# Patient Record
Sex: Female | Born: 1970 | Race: White | Hispanic: No | Marital: Married | Smoking: Former smoker
Health system: Southern US, Community
[De-identification: ages and names within clinical notes are randomized; demographics above are authoritative.]

## PROBLEM LIST (undated history)

## (undated) DIAGNOSIS — K219 Gastro-esophageal reflux disease without esophagitis: Secondary | ICD-10-CM

## (undated) DIAGNOSIS — Z87898 Personal history of other specified conditions: Secondary | ICD-10-CM

## (undated) DIAGNOSIS — I10 Essential (primary) hypertension: Secondary | ICD-10-CM

## (undated) DIAGNOSIS — Z9889 Other specified postprocedural states: Secondary | ICD-10-CM

## (undated) DIAGNOSIS — Z973 Presence of spectacles and contact lenses: Secondary | ICD-10-CM

## (undated) DIAGNOSIS — N926 Irregular menstruation, unspecified: Secondary | ICD-10-CM

## (undated) DIAGNOSIS — F419 Anxiety disorder, unspecified: Secondary | ICD-10-CM

## (undated) DIAGNOSIS — R112 Nausea with vomiting, unspecified: Secondary | ICD-10-CM

## (undated) HISTORY — DX: Morbid (severe) obesity due to excess calories: E66.01

## (undated) HISTORY — DX: Anxiety disorder, unspecified: F41.9

## (undated) HISTORY — PX: COLPOSCOPY VULVA: SUR281

## (undated) HISTORY — PX: CHOLECYSTECTOMY: SHX55

## (undated) HISTORY — DX: Irregular menstruation, unspecified: N92.6

## (undated) HISTORY — PX: GALLBLADDER SURGERY: SHX652

## (undated) HISTORY — DX: Essential (primary) hypertension: I10

## (undated) HISTORY — DX: Personal history of other specified conditions: Z87.898

---

## 2004-05-09 HISTORY — PX: TUBAL LIGATION: SHX77

## 2005-01-07 ENCOUNTER — Ambulatory Visit (HOSPITAL_COMMUNITY): Admission: RE | Admit: 2005-01-07 | Discharge: 2005-01-07 | Payer: Self-pay | Admitting: Gynecology

## 2005-07-25 ENCOUNTER — Ambulatory Visit: Payer: Self-pay | Admitting: Family Medicine

## 2006-06-19 ENCOUNTER — Ambulatory Visit: Payer: Self-pay | Admitting: Gynecology

## 2006-06-19 ENCOUNTER — Encounter (INDEPENDENT_AMBULATORY_CARE_PROVIDER_SITE_OTHER): Payer: Self-pay | Admitting: Gynecology

## 2007-05-22 ENCOUNTER — Encounter: Payer: Self-pay | Admitting: Obstetrics & Gynecology

## 2007-05-22 ENCOUNTER — Ambulatory Visit: Payer: Self-pay | Admitting: Obstetrics & Gynecology

## 2007-06-26 ENCOUNTER — Ambulatory Visit: Payer: Self-pay | Admitting: Gynecology

## 2009-05-18 ENCOUNTER — Other Ambulatory Visit: Admission: RE | Admit: 2009-05-18 | Discharge: 2009-05-18 | Payer: Self-pay | Admitting: Obstetrics and Gynecology

## 2009-05-18 ENCOUNTER — Ambulatory Visit: Payer: Self-pay | Admitting: Obstetrics and Gynecology

## 2010-09-21 NOTE — Assessment & Plan Note (Signed)
Natalie Gordon, Natalie Gordon             ACCOUNT NO.:  [id number]   MEDICAL RECORD NO.:  [id number]          PATIENT TYPE:  POB   LOCATION:  [facility] at [facility]         FACILITY:  [facility]   PHYSICIAN:  Argentina Donovan, MD        DATE OF BIRTH:  1970-06-18   DATE OF SERVICE:  05/18/2009                                  CLINIC NOTE   The patient is a 40 year old Caucasian female gravida 2, para 2-0-0-2  who is in for annual physical examination.  The last time she was in  here was 2 years ago.  Since then she has also been seeing internist.  She was started on hydrochlorothiazide for her blood pressure, which was  significantly high last time she was here and she was on Tri-Sprintec to  control her periods but the Dr. Percell Belt stopped that until her blood  pressure got under control.  She started back on it at her own herself  by, I really do not think she took a break at all because she said she  cannot even get out of bathroom when she does not have the pills to  control the bleeding, but she is out of work now, has difficulty as far  as the amount of medication she has and paying for it, i.e. she was on  Lexapro for depression and stopped that.  I talked to her about some of  the discount pharmacies and we are going to start her back on generic  Prozac, we are going to increase her hydrochlorothiazide to 25 mg a day.  She is seeing her internist shortly and see if that gets her blood  pressure under better condition and have her taken a list of blood  pressures whenever she goes to the pharmacy and take it to her internist  when she sees her.  Also, we are switching her to Sprintec and she seems  to be okay with that.  Other than that except for the nervousness she  has because she is out of a job and the stress that she is under, she is  in pretty good health.   PAST MEDICAL HISTORY:  She has had elevated blood pressure.   PAST SURGICAL HISTORY:  She has had a gallbladder removed, bilateral  tubal ligation in the past.   SOCIAL HISTORY:  She is divorced.  She does not drink or smoke.   MEDICATION:  She is currently on is the Tri-Sprintec and  hydrochlorothiazide.   REVIEW OF SYSTEMS:  Negative with the exception of present illness.  As  a matter fact, the headache she was having in the past since she started  on blood pressure medication have improved enormously.   PHYSICAL EXAMINATION:  VITAL SIGNS:  She weighs exactly the amount she  did 2 years ago, 231 pounds.  She is 5 feet 4 inches tall.  Her blood  pressure today when she came in was 147/97, significantly better than  before, and I think she may need another medication besides  hydrochlorothiazide, but we will up that to 25 mg on that.  GENERAL:  She is a well-developed, slightly obese white female in no  acute distress.  HEENT:  Within normal limits.  NECK:  Supple.  Thyroid symmetrical, no masses.  BACK:  Erect.  No CVA tenderness.  LUNGS:  Clear to auscultation, percussion.  HEART:  No murmur, normal sinus rhythm.  ABDOMEN:  Soft, flat, nontender.  No masses or organomegaly.  BREASTS:  Symmetrical, no axillary nodes or supraclavicular nodes  palpable.  No nipple discharge.  No dominant mass.  EXTREMITIES:  No edema.  No varicosities.  GENITALIA:  External genitalia is normal.  BUS within normal limits.  Vagina is clean and well rugated.  The cervix is clean, parous, and  slight ectropion.  Pap smear was taken.  The uterus could not be well  outlined nor could the adnexa, but there was no feeling that it was  enlarged.  RECTAL:  Negative for masses.   IMPRESSION:  Normal physical examination with the exception of the  patient's weight, mild elevation of blood pressure.   PLAN:  Sprintec ordered for the patient and monitor her blood pressure  and talk to her internist.  I have also suggested that she may think  about a Mirena if the internist insists that she stops the birth control  pills.  Also add  Prozac to this and see how well she does on that.           ______________________________  Argentina Donovan, MD     PR/MEDQ  D:  05/18/2009  T:  05/19/2009  Job:  [id number]

## 2010-09-21 NOTE — Assessment & Plan Note (Signed)
[name], [name]             ACCOUNT NO.:  [id number]   MEDICAL RECORD NO.:  [id number]          PATIENT TYPE:  POB   LOCATION:  [facility] at [facility]         FACILITY:  [facility]   PHYSICIAN:  Matt Deleeuw, N.P.       DATE OF BIRTH:  April 07, 1971   DATE OF SERVICE:  05/22/2007                                  CLINIC NOTE   HISTORY OF PRESENT ILLNESS:  Ms. Natalie Gordon is a 40 year old white female  who presents today for her annual exam.  Her chief complaint today is  stress related to child support and dealing with her ex-husband.  She  also reports that she was laid off from her job of 10 years in November  of 2008 with her severance package ending in a month and a half and is  having a difficult time securing employment.  After taking her blood  pressure and telling her that it was elevated, she did admit to knowing  that she had a high blood pressure recently but has not had this  evaluated.   PAST MEDICAL HISTORY:  Significant for pneumonia, elevated blood  pressure.   PAST SURGICAL HISTORY:  Unchanged.   GYN HISTORY:  Bilateral tubal ligation in 2006. She has been long-  cycling on Sprintec, so she is not sure when her last menstrual period  was.   SOCIAL HISTORY:  Divorced, one sexual partner now.  Two children, ages  50 and four.  Unemployed at this point in time which is a source of  stress.  She denies alcohol and tobacco use.  Menstrual history again is  long-cycling on Sprintec and does not know her last menstrual period.   CURRENT MEDICATIONS:  Lexapro 20 mg and Tri Sprintec 28.   REVIEW OF SYSTEMS:  Complains of headaches which she attributes to  stress, blood pressure.   ALLERGIES:  NO KNOWN DRUG ALLERGIES.   PHYSICAL EXAM:  VITAL SIGNS:  Today blood pressure 162/105 with a  recheck of 150/104, weight 231 pounds, height 5 feet 4 inches.  HEAD, EARS, NOSE AND THROAT:  Without thyroid enlargement.  CHEST:  Bilaterally clear.  HEART:  Rate and rhythm are regular  without murmur, gallop or cardiac  enlargement.  BREASTS:  Bilaterally soft without masses, nodes or nipple  discharge.  ABDOMEN:  Soft and nontender.  The patient is overweight.  PELVIC:  External genitalia within normal limits for female.  Vagina is  clean and rugose.  Cervix is parous and clean.  The uterus is of normal  shape, size and contour.  Adnexa bilaterally clear.  Rectovaginal exam  confirms.  EXTREMITIES:  Within normal limits.   IMPRESSION:  1. Elevated blood pressure.  2. Normal gynecologic exam.  3. Depression treated with Lexapro.  4. Increased stress due to social reasons.   PLAN:  Blood pressures were written on a prescription pad and the  patient will go to her primary care physician, Dr. Yetta Barre, in [place],  [facility] tomorrow.   DISPOSITION:  1. Offers walk-in hours in the morning, and she plans to be seen      there.  2. Discontinue Tri Sprintec immediately.  She has had a tubal  ligation      so does not need this for contraception.  However, was on Sprintec      to control irregular cycles.  After her blood pressure has been      evaluated, we will consider __________  IUD.  3. Pap smear results in 1-2 weeks. Will notify the patient of the      results.  4. Discussed at length with the patient the relationship of birth      control pills and elevated blood pressure in her age, and she      understands why they are being discontinued.  She is to return here      in 1 year for her annual exam or at which time she decides to use      the IUD for control of cycles.           ______________________________  Matt Elkhatib, N.P.     EMK/MEDQ  D:  05/22/2007  T:  05/23/2007  Job:  [id number]

## 2010-09-24 NOTE — Op Note (Signed)
[name], Natalie Gordon             ACCOUNT NO.:  [id number]   MEDICAL RECORD NO.:  [id number]          PATIENT TYPE:  AMB   LOCATION:  [facility]                           FACILITY:  [facility]   PHYSICIAN:  Ginger Carne, MD  DATE OF BIRTH:  [birth year]   DATE OF PROCEDURE:  01/07/2005  DATE OF DISCHARGE:                                 OPERATIVE REPORT   PREOPERATIVE DIAGNOSIS:  Request for sterilization.   POSTOPERATIVE DIAGNOSIS:  Request for sterilization.   PROCEDURE:  Bilateral laparoscopic tubal banding.   SURGEON:  Ginger Carne, M.D.   ASSISTANT:  None.   COMPLICATIONS:  None immediate.   ESTIMATED BLOOD LOSS:  Minimal.   SPECIMEN:  None.   ANESTHESIA:  General.   OPERATIVE FINDINGS:  Uterus, tubes and ovaries were unremarkable.  Both  tubes identified separate and apart from the respective round ligaments and  identified from their isthmus to fimbriated ends.  Upper abdomen normal.   OPERATIVE PROCEDURE:  The patient prepped and draped in the usual fashion  and placed in a lithotomy position.  Betadine solution used for antiseptic  and the patient was catheterized prior to procedure.  After adequate general  anesthesia, tenaculum placed on the anterior lip of the cervix and a Hickman  tenaculum placed in the endocervical canal.  Afterwards, a vertical  infraumbilical incision was made and the Veress needle placed in the  abdomen.  Opening and closing pressures were 10 and 15 mmHg.  A medial  release trocar placed in the same incision.  Laparoscope placed in the  trocar sleeve.  Afterwards, an 8 mm port was made under direct visualization  in the left lower quadrant.  Following this, the patient was placed in  Trendelenburg and both tubes identified as aforementioned.  Falope ring  bands placed at the isthmus-ampullary junction of both tubes and no bleeding  noted and they were secure.  Gas released, trocars removed.  Closure of 10  mm fascia site was with 0 Vicryl  suture and a 4-0 Vicryl for subcuticular  closure.  The instrument and sponge count were correct.  The patient  tolerated the procedure well and was transferred to the post anesthesia  recovery room in excellent condition.     Ginger Carne, MD  Electronically Signed    SHB/MEDQ  D:  01/07/2005  T:  01/07/2005  Job:  [id number]

## 2012-03-13 ENCOUNTER — Encounter: Payer: Self-pay | Admitting: Obstetrics and Gynecology

## 2012-03-13 ENCOUNTER — Ambulatory Visit (INDEPENDENT_AMBULATORY_CARE_PROVIDER_SITE_OTHER): Payer: PRIVATE HEALTH INSURANCE | Admitting: Obstetrics and Gynecology

## 2012-03-13 VITALS — BP 165/92 | HR 71 | Ht 64.0 in | Wt 236.0 lb

## 2012-03-13 DIAGNOSIS — Z124 Encounter for screening for malignant neoplasm of cervix: Secondary | ICD-10-CM

## 2012-03-13 DIAGNOSIS — N926 Irregular menstruation, unspecified: Secondary | ICD-10-CM | POA: Insufficient documentation

## 2012-03-13 DIAGNOSIS — N949 Unspecified condition associated with female genital organs and menstrual cycle: Secondary | ICD-10-CM

## 2012-03-13 DIAGNOSIS — Z1151 Encounter for screening for human papillomavirus (HPV): Secondary | ICD-10-CM

## 2012-03-13 DIAGNOSIS — N925 Other specified irregular menstruation: Secondary | ICD-10-CM

## 2012-03-13 DIAGNOSIS — Z01419 Encounter for gynecological examination (general) (routine) without abnormal findings: Secondary | ICD-10-CM

## 2012-03-13 DIAGNOSIS — N938 Other specified abnormal uterine and vaginal bleeding: Secondary | ICD-10-CM | POA: Insufficient documentation

## 2012-03-13 LAB — LIPID PANEL
Cholesterol: 149 mg/dL (ref 0–200)
HDL: 44 mg/dL (ref >39)
Total CHOL/HDL Ratio: 3.4 Ratio
Triglycerides: 164 mg/dL — ABNORMAL HIGH (ref <150)
VLDL: 33 mg/dL (ref 0–40)

## 2012-03-13 MED ORDER — HYDROCHLOROTHIAZIDE 25 MG PO TABS: 25.0000 mg | ORAL_TABLET | Freq: Every day | ORAL | Status: DC

## 2012-03-13 MED ORDER — INFLUENZA VIRUS VACC SPLIT PF IM SUSP: 0.5000 mL | Freq: Once | INTRAMUSCULAR | Status: DC

## 2012-03-13 NOTE — Patient Instructions (Addendum)
Preventive Care for Adults, Female A healthy lifestyle and preventive care can promote health and wellness. Preventive health guidelines for women include the following key practices.  A routine yearly physical is a good way to check with your caregiver about your health and preventive screening. It is a chance to share any concerns and updates on your health, and to receive a thorough exam.  Visit your dentist for a routine exam and preventive care every 6 months. Brush your teeth twice a day and floss once a day. Good oral hygiene prevents tooth decay and gum disease.  The frequency of eye exams is based on your age, health, family medical history, use of contact lenses, and other factors. Follow your caregiver's recommendations for frequency of eye exams.  Eat a healthy diet. Foods like vegetables, fruits, whole grains, low-fat dairy products, and lean protein foods contain the nutrients you need without too many calories. Decrease your intake of foods high in solid fats, added sugars, and salt. Eat the right amount of calories for you.Get information about a proper diet from your caregiver, if necessary.  Regular physical exercise is one of the most important things you can do for your health. Most adults should get at least 150 minutes of moderate-intensity exercise (any activity that increases your heart rate and causes you to sweat) each week. In addition, most adults need muscle-strengthening exercises on 2 or more days a week.  Maintain a healthy weight. The body mass index (BMI) is a screening tool to identify possible weight problems. It provides an estimate of body fat based on height and weight. Your caregiver can help determine your BMI, and can help you achieve or maintain a healthy weight.For adults 20 years and older:  A BMI below 18.5 is considered underweight.  A BMI of 18.5 to 24.9 is normal.  A BMI of 25 to 29.9 is considered overweight.  A BMI of 30 and above is  considered obese.  Maintain normal blood lipids and cholesterol levels by exercising and minimizing your intake of saturated fat. Eat a balanced diet with plenty of fruit and vegetables. Blood tests for lipids and cholesterol should begin at age 20 and be repeated every 5 years. If your lipid or cholesterol levels are high, you are over 50, or you are at high risk for heart disease, you may need your cholesterol levels checked more frequently.Ongoing high lipid and cholesterol levels should be treated with medicines if diet and exercise are not effective.  If you smoke, find out from your caregiver how to quit. If you do not use tobacco, do not start.  If you are pregnant, do not drink alcohol. If you are breastfeeding, be very cautious about drinking alcohol. If you are not pregnant and choose to drink alcohol, do not exceed 1 drink per day. One drink is considered to be 12 ounces (355 mL) of beer, 5 ounces (148 mL) of wine, or 1.5 ounces (44 mL) of liquor.  Avoid use of street drugs. Do not share needles with anyone. Ask for help if you need support or instructions about stopping the use of drugs.  High blood pressure causes heart disease and increases the risk of stroke. Your blood pressure should be checked at least every 1 to 2 years. Ongoing high blood pressure should be treated with medicines if weight loss and exercise are not effective.  If you are 55 to 40 years old, ask your caregiver if you should take aspirin to prevent strokes.  Diabetes   screening involves taking a blood sample to check your fasting blood sugar level. This should be done once every 3 years, after age 45, if you are within normal weight and without risk factors for diabetes. Testing should be considered at a younger age or be carried out more frequently if you are overweight and have at least 1 risk factor for diabetes.  Breast cancer screening is essential preventive care for women. You should practice "breast  self-awareness." This means understanding the normal appearance and feel of your breasts and may include breast self-examination. Any changes detected, no matter how small, should be reported to a caregiver. Women in their 20s and 30s should have a clinical breast exam (CBE) by a caregiver as part of a regular health exam every 1 to 3 years. After age 40, women should have a CBE every year. Starting at age 40, women should consider having a mammography (breast X-ray test) every year. Women who have a family history of breast cancer should talk to their caregiver about genetic screening. Women at a high risk of breast cancer should talk to their caregivers about having magnetic resonance imaging (MRI) and a mammography every year.  The Pap test is a screening test for cervical cancer. A Pap test can show cell changes on the cervix that might become cervical cancer if left untreated. A Pap test is a procedure in which cells are obtained and examined from the lower end of the uterus (cervix).  Women should have a Pap test starting at age 21.  Between ages 21 and 29, Pap tests should be repeated every 2 years.  Beginning at age 30, you should have a Pap test every 3 years as long as the past 3 Pap tests have been normal.  Some women have medical problems that increase the chance of getting cervical cancer. Talk to your caregiver about these problems. It is especially important to talk to your caregiver if a new problem develops soon after your last Pap test. In these cases, your caregiver may recommend more frequent screening and Pap tests.  The above recommendations are the same for women who have or have not gotten the vaccine for human papillomavirus (HPV).  If you had a hysterectomy for a problem that was not cancer or a condition that could lead to cancer, then you no longer need Pap tests. Even if you no longer need a Pap test, a regular exam is a good idea to make sure no other problems are  starting.  If you are between ages 65 and 70, and you have had normal Pap tests going back 10 years, you no longer need Pap tests. Even if you no longer need a Pap test, a regular exam is a good idea to make sure no other problems are starting.  If you have had past treatment for cervical cancer or a condition that could lead to cancer, you need Pap tests and screening for cancer for at least 20 years after your treatment.  If Pap tests have been discontinued, risk factors (such as a new sexual partner) need to be reassessed to determine if screening should be resumed.  The HPV test is an additional test that may be used for cervical cancer screening. The HPV test looks for the virus that can cause the cell changes on the cervix. The cells collected during the Pap test can be tested for HPV. The HPV test could be used to screen women aged 30 years and older, and should   be used in women of any age who have unclear Pap test results. After the age of 30, women should have HPV testing at the same frequency as a Pap test.  Colorectal cancer can be detected and often prevented. Most routine colorectal cancer screening begins at the age of 50 and continues through age 75. However, your caregiver may recommend screening at an earlier age if you have risk factors for colon cancer. On a yearly basis, your caregiver may provide home test kits to check for hidden blood in the stool. Use of a small camera at the end of a tube, to directly examine the colon (sigmoidoscopy or colonoscopy), can detect the earliest forms of colorectal cancer. Talk to your caregiver about this at age 50, when routine screening begins. Direct examination of the colon should be repeated every 5 to 10 years through age 75, unless early forms of pre-cancerous polyps or small growths are found.  Hepatitis C blood testing is recommended for all people born from 1945 through 1965 and any individual with known risks for hepatitis C.  Practice  safe sex. Use condoms and avoid high-risk sexual practices to reduce the spread of sexually transmitted infections (STIs). STIs include gonorrhea, chlamydia, syphilis, trichomonas, herpes, HPV, and human immunodeficiency virus (HIV). Herpes, HIV, and HPV are viral illnesses that have no cure. They can result in disability, cancer, and death. Sexually active women aged 25 and younger should be checked for chlamydia. Older women with new or multiple partners should also be tested for chlamydia. Testing for other STIs is recommended if you are sexually active and at increased risk.  Osteoporosis is a disease in which the bones lose minerals and strength with aging. This can result in serious bone fractures. The risk of osteoporosis can be identified using a bone density scan. Women ages 65 and over and women at risk for fractures or osteoporosis should discuss screening with their caregivers. Ask your caregiver whether you should take a calcium supplement or vitamin D to reduce the rate of osteoporosis.  Menopause can be associated with physical symptoms and risks. Hormone replacement therapy is available to decrease symptoms and risks. You should talk to your caregiver about whether hormone replacement therapy is right for you.  Use sunscreen with sun protection factor (SPF) of 30 or more. Apply sunscreen liberally and repeatedly throughout the day. You should seek shade when your shadow is shorter than you. Protect yourself by wearing long sleeves, pants, a wide-brimmed hat, and sunglasses year round, whenever you are outdoors.  Once a month, do a whole body skin exam, using a mirror to look at the skin on your back. Notify your caregiver of new moles, moles that have irregular borders, moles that are larger than a pencil eraser, or moles that have changed in shape or color.  Stay current with required immunizations.  Influenza. You need a dose every fall (or winter). The composition of the flu vaccine  changes each year, so being vaccinated once is not enough.  Pneumococcal polysaccharide. You need 1 to 2 doses if you smoke cigarettes or if you have certain chronic medical conditions. You need 1 dose at age 65 (or older) if you have never been vaccinated.  Tetanus, diphtheria, pertussis (Tdap, Td). Get 1 dose of Tdap vaccine if you are younger than age 65, are over 65 and have contact with an infant, are a healthcare worker, are pregnant, or simply want to be protected from whooping cough. After that, you need a Td   booster dose every 10 years. Consult your caregiver if you have not had at least 3 tetanus and diphtheria-containing shots sometime in your life or have a deep or dirty wound.  HPV. You need this vaccine if you are a woman age 26 or younger. The vaccine is given in 3 doses over 6 months.  Measles, mumps, rubella (MMR). You need at least 1 dose of MMR if you were born in 1957 or later. You may also need a second dose.  Meningococcal. If you are age 19 to 21 and a first-year college student living in a residence hall, or have one of several medical conditions, you need to get vaccinated against meningococcal disease. You may also need additional booster doses.  Zoster (shingles). If you are age 60 or older, you should get this vaccine.  Varicella (chickenpox). If you have never had chickenpox or you were vaccinated but received only 1 dose, talk to your caregiver to find out if you need this vaccine.  Hepatitis A. You need this vaccine if you have a specific risk factor for hepatitis A virus infection or you simply wish to be protected from this disease. The vaccine is usually given as 2 doses, 6 to 18 months apart.  Hepatitis B. You need this vaccine if you have a specific risk factor for hepatitis B virus infection or you simply wish to be protected from this disease. The vaccine is given in 3 doses, usually over 6 months. Preventive Services / Frequency Ages 40 to 64  Blood  pressure check.** / Every 1 to 2 years.  Lipid and cholesterol check.** / Every 5 years beginning at age 20.  Clinical breast exam.** / Every year after age 40.  Mammogram.** / Every year beginning at age 40 and continuing for as long as you are in good health. Consult with your caregiver.  Pap test.** / Every 3 years starting at age 30 through age 65 or 70 with a history of 3 consecutive normal Pap tests.  HPV screening.** / Every 3 years from ages 30 through ages 65 to 70 with a history of 3 consecutive normal Pap tests.  Fecal occult blood test (FOBT) of stool. / Every year beginning at age 50 and continuing until age 75. You may not need to do this test if you get a colonoscopy every 10 years.  Flexible sigmoidoscopy or colonoscopy.** / Every 5 years for a flexible sigmoidoscopy or every 10 years for a colonoscopy beginning at age 50 and continuing until age 75.  Hepatitis C blood test.** / For all people born from 1945 through 1965 and any individual with known risks for hepatitis C.  Skin self-exam. / Monthly.  Influenza immunization.** / Every year.  Pneumococcal polysaccharide immunization.** / 1 to 2 doses if you smoke cigarettes or if you have certain chronic medical conditions.  Tetanus, diphtheria, pertussis (Tdap, Td) immunization.** / A one-time dose of Tdap vaccine. After that, you need a Td booster dose every 10 years.  Measles, mumps, rubella (MMR) immunization. / You need at least 1 dose of MMR if you were born in 1957 or later. You may also need a second dose.  Varicella immunization.** / Consult your caregiver.  Meningococcal immunization.** / Consult your caregiver.  Hepatitis A immunization.** / Consult your caregiver. 2 doses, 6 to 18 months apart.  Hepatitis B immunization.** / Consult your caregiver. 3 doses, usually over 6 months. ** Family history and personal history of risk and conditions may change your caregiver's recommendations.   Document Released:  06/21/2001 Document Revised: 07/18/2011 Document Reviewed: 09/20/2010 [employer] Patient Information 2013 [employer].  

## 2012-03-13 NOTE — Progress Notes (Signed)
  Subjective:     Natalie Gordon is a 41 y.o. female with LMP 02/11/2012 and BMI 40 who is here for a comprehensive physical exam. The patient reports heavy menses. Patient has monthly menses lasting 5-7 days every month but with a heavy flow and passage of clots. Patient states this bleeding pattern has been present since her BTL. It was well controlled on birth control pills but due to HTN, OCP had to be discontinued. Due to a lapse in insurance coverage, patient has not seen a doctor in 2 years. She has not been taking her antihypertensive either.  History   Social History  . Marital Status: Married    Spouse Name: N/A    Number of Children: N/A  . Years of Education: N/A   Occupational History  . Not on file.   Social History Main Topics  . Smoking status: Former Games developer  . Smokeless tobacco: Not on file     Comment: quit x 31yrs ago  . Alcohol Use: Yes     Comment: social  . Drug Use: No  . Sexually Active: Yes -- Female partner(s)    Birth Control/ Protection: Surgical     Comment: tubaligation.   Other Topics Concern  . Not on file   Social History Narrative  . No narrative on file   Health Maintenance  Topic Date Due  . Pap Smear  05/19/1989  . Tetanus/tdap  05/19/1990  . Influenza Vaccine  01/08/2012   Past Medical History  Diagnosis Date  . Hypertension   . Irregular menstrual cycle   . Anxiety   . History of abnormal Pap smear     At age 72   Past Surgical History  Procedure Date  . Tubal ligation 2006  . Gallbladder surgery   . Colposcopy vulva     abnormal cell.  . Cholecystectomy        Review of Systems A comprehensive review of systems was negative.   Objective:     GENERAL: Well-developed, well-nourished female in no acute distress.  HEENT: Normocephalic, atraumatic. Sclerae anicteric.  NECK: Supple. Normal thyroid.  LUNGS: Clear to auscultation bilaterally.  HEART: Regular rate and rhythm. BREASTS: Symmetric in size. No palpable masses or  lymphadenopathy, skin changes, or nipple drainage. ABDOMEN: Soft, nontender, nondistended. No organomegaly. PELVIC: Normal external female genitalia. Vagina is pink and rugated.  Normal discharge. Normal appearing cervix. Uterus is normal in size. No adnexal mass or tenderness. EXTREMITIES: No cyanosis, clubbing, or edema, 2+ distal pulses.    Assessment:    Healthy female exam.      Plan:    Pap smear  Collected Patient advised to continue monthly self breast and vulva exam Patient advised to follow-up with internist for better management of hypertension. Will give refill of HCTZ in the meantime Will schedule pelvic ultrasound Medical management of her DUB with Mirena IUD discussed RTC in 2 weeks for endometrial biopsy and results review See After Visit Summary for Counseling Recommendations

## 2012-03-14 LAB — COMPREHENSIVE METABOLIC PANEL
CO2: 24 mEq/L (ref 19–32)
Creat: 0.61 mg/dL (ref 0.50–1.10)
Glucose, Bld: 87 mg/dL (ref 70–99)
Total Bilirubin: 0.3 mg/dL (ref 0.3–1.2)

## 2012-03-27 ENCOUNTER — Ambulatory Visit: Payer: Self-pay | Admitting: Obstetrics and Gynecology

## 2012-04-04 ENCOUNTER — Encounter: Payer: Self-pay | Admitting: Obstetrics and Gynecology

## 2012-04-04 ENCOUNTER — Ambulatory Visit (INDEPENDENT_AMBULATORY_CARE_PROVIDER_SITE_OTHER): Payer: PRIVATE HEALTH INSURANCE | Admitting: Obstetrics and Gynecology

## 2012-04-04 VITALS — BP 159/101 | HR 85 | Ht 64.0 in | Wt 236.0 lb

## 2012-04-04 DIAGNOSIS — N949 Unspecified condition associated with female genital organs and menstrual cycle: Secondary | ICD-10-CM

## 2012-04-04 DIAGNOSIS — Z01812 Encounter for preprocedural laboratory examination: Secondary | ICD-10-CM

## 2012-04-04 DIAGNOSIS — N938 Other specified abnormal uterine and vaginal bleeding: Secondary | ICD-10-CM

## 2012-04-04 DIAGNOSIS — N926 Irregular menstruation, unspecified: Secondary | ICD-10-CM

## 2012-04-04 NOTE — Progress Notes (Signed)
Interested in taking something for anxiety, something low that might help.

## 2012-04-04 NOTE — Progress Notes (Signed)
Patient ID: Natalie Gordon, female   DOB: 1970/06/29, 41 y.o.   MRN: [id number] Patient presents today to discuss results of ultrasound and have endometrial biopsy performed. Results of the ultrasound were reviewed and explained to the patient which demonstrated a uterus measuring 9 x 7 x 5 cm with an endometrial lining of 1.32 cm. Normal ovaries. No free fluid. 2.65 x 2.70 x 2.86 cm anterior right fundal fibroid.  ENDOMETRIAL BIOPSY     The indications for endometrial biopsy were reviewed.   Risks of the biopsy including cramping, bleeding, infection, uterine perforation, inadequate specimen and need for additional procedures  were discussed. The patient states she understands and agrees to undergo procedure today. Consent was signed. Time out was performed. Urine HCG was negative. A sterile speculum was placed in the patient's vagina and the cervix was prepped with Betadine. A single-toothed tenaculum was placed on the anterior lip of the cervix to stabilize it. The uterine cavity was sounded to a depth of 9 cm using the uterine sound. The 3 mm pipelle was introduced into the endometrial cavity without difficulty, 2 passes were made.  A  moderate amount of tissue was  sent to pathology. The instruments were removed from the patient's vagina. Minimal bleeding from the cervix was noted. The patient tolerated the procedure well.  Routine post-procedure instructions were given to the patient. The patient will follow up in two weeks to review the results and for further management.    Patient will RTC in 2 weeks for results and Mirena IUD insertion

## 2012-04-09 ENCOUNTER — Encounter: Payer: Self-pay | Admitting: Obstetrics and Gynecology

## 2012-04-10 ENCOUNTER — Ambulatory Visit: Payer: PRIVATE HEALTH INSURANCE | Admitting: Family Medicine

## 2012-04-12 ENCOUNTER — Ambulatory Visit: Payer: Self-pay | Admitting: Obstetrics and Gynecology

## 2012-04-16 ENCOUNTER — Ambulatory Visit (INDEPENDENT_AMBULATORY_CARE_PROVIDER_SITE_OTHER): Payer: PRIVATE HEALTH INSURANCE | Admitting: Obstetrics & Gynecology

## 2012-04-16 ENCOUNTER — Encounter: Payer: Self-pay | Admitting: Obstetrics & Gynecology

## 2012-04-16 VITALS — BP 152/98 | HR 72 | Ht 64.0 in | Wt 235.0 lb

## 2012-04-16 DIAGNOSIS — Z01812 Encounter for preprocedural laboratory examination: Secondary | ICD-10-CM

## 2012-04-16 DIAGNOSIS — N949 Unspecified condition associated with female genital organs and menstrual cycle: Secondary | ICD-10-CM

## 2012-04-16 DIAGNOSIS — N938 Other specified abnormal uterine and vaginal bleeding: Secondary | ICD-10-CM

## 2012-04-16 DIAGNOSIS — N920 Excessive and frequent menstruation with regular cycle: Secondary | ICD-10-CM

## 2012-04-16 NOTE — Progress Notes (Signed)
  Subjective:    Patient ID: Natalie Gordon, female    DOB: 1971/01/04, 41 y.o.   MRN: [id number]  HPI 41 yo MW lady with 7 day periods that are exceedingly heavy, "running down my legs". She was here today for a Mirena, but after she heard about Novasure endometrial ablation, she opts for this.   Review of Systems   Mammogram 12/13 Endometrial biopsy normal Pap normal Objective:   Physical Exam        Assessment & Plan:  Menorrhagia- check TSH, CBC, cervical cultures I will do a Novasure on 04-25-12.

## 2012-04-17 ENCOUNTER — Telehealth: Payer: Self-pay | Admitting: *Deleted

## 2012-04-17 MED ORDER — HYDROCHLOROTHIAZIDE 25 MG PO TABS: 25.0000 mg | ORAL_TABLET | Freq: Every day | ORAL | Status: DC

## 2012-04-17 NOTE — Telephone Encounter (Signed)
Rx printed and so I resent.

## 2012-04-17 NOTE — Telephone Encounter (Signed)
Patient needs refill of her hctz medication.  She is working on finding a primary care physician.

## 2012-04-18 ENCOUNTER — Encounter (HOSPITAL_COMMUNITY): Payer: Self-pay | Admitting: Pharmacist

## 2012-04-18 LAB — CBC
HCT: 38.6 % (ref 36.0–46.0)
MCH: 28.9 pg (ref 26.0–34.0)
MCHC: 33.9 g/dL (ref 30.0–36.0)
MCV: 85.2 fL (ref 78.0–100.0)
RDW: 14.1 % (ref 11.5–15.5)

## 2012-04-24 ENCOUNTER — Encounter (HOSPITAL_COMMUNITY): Payer: Self-pay

## 2012-04-24 ENCOUNTER — Encounter (HOSPITAL_COMMUNITY)
Admission: RE | Admit: 2012-04-24 | Discharge: 2012-04-24 | Disposition: A | Discharge status: Home / Self Care | Payer: PRIVATE HEALTH INSURANCE | Source: Ambulatory Visit | Attending: Obstetrics & Gynecology | Admitting: Obstetrics & Gynecology

## 2012-04-24 HISTORY — DX: Gastro-esophageal reflux disease without esophagitis: K21.9

## 2012-04-24 LAB — CBC
HCT: 40.9 % (ref 36.0–46.0)
Hemoglobin: 13.3 g/dL (ref 12.0–15.0)
MCH: 29 pg (ref 26.0–34.0)
MCHC: 32.5 g/dL (ref 30.0–36.0)
RBC: 4.58 MIL/uL (ref 3.87–5.11)

## 2012-04-24 NOTE — Patient Instructions (Addendum)
[address]  04/24/2012   Your procedure is scheduled on:  04/25/12  Enter through the Main Entrance of [facility] at 8 AM.  Pick up the phone at the desk and dial [phone].   Call this number if you have problems the morning of surgery: [phone]   Remember:   Do not eat food:After Midnight.  Do not drink clear liquids: After Midnight.  Take these medicines the morning of surgery with A SIP OF WATER: Blood pressure medication and Prilosec   Do not wear jewelry, make-up or nail polish.  Do not wear lotions, powders, or perfumes. You may wear deodorant.  Do not shave 48 hours prior to surgery.  Do not bring valuables to the hospital.  Contacts, dentures or bridgework may not be worn into surgery.  Leave suitcase in the car. After surgery it may be brought to your room.  For patients admitted to the hospital, checkout time is 11:00 AM the day of discharge.   Patients discharged the day of surgery will not be allowed to drive home.  Name and phone number of your driver: husband and mother  Special Instructions: Shower using CHG 2 nights before surgery and the night before surgery.  If you shower the day of surgery use CHG.  Use special wash - you have one bottle of CHG for all showers.  You should use approximately 1/3 of the bottle for each shower.   Please read over the following fact sheets that you were given: Surgical Site Infection Prevention

## 2012-04-25 ENCOUNTER — Encounter (HOSPITAL_COMMUNITY): Payer: Self-pay | Admitting: Anesthesiology

## 2012-04-25 ENCOUNTER — Encounter (HOSPITAL_COMMUNITY): Payer: Self-pay | Admitting: *Deleted

## 2012-04-25 ENCOUNTER — Ambulatory Visit (HOSPITAL_COMMUNITY): Payer: PRIVATE HEALTH INSURANCE | Admitting: Anesthesiology

## 2012-04-25 ENCOUNTER — Encounter (HOSPITAL_COMMUNITY)
Admission: RE | Disposition: A | Discharge status: Home / Self Care | Payer: Self-pay | Source: Ambulatory Visit | Attending: Obstetrics & Gynecology

## 2012-04-25 ENCOUNTER — Ambulatory Visit (HOSPITAL_COMMUNITY)
Admission: RE | Admit: 2012-04-25 | Discharge: 2012-04-25 | Disposition: A | Discharge status: Home / Self Care | Payer: PRIVATE HEALTH INSURANCE | Source: Ambulatory Visit | Attending: Obstetrics & Gynecology | Admitting: Obstetrics & Gynecology

## 2012-04-25 DIAGNOSIS — Z01812 Encounter for preprocedural laboratory examination: Secondary | ICD-10-CM | POA: Insufficient documentation

## 2012-04-25 DIAGNOSIS — Z01818 Encounter for other preprocedural examination: Secondary | ICD-10-CM | POA: Insufficient documentation

## 2012-04-25 DIAGNOSIS — N920 Excessive and frequent menstruation with regular cycle: Secondary | ICD-10-CM | POA: Insufficient documentation

## 2012-04-25 HISTORY — PX: NOVASURE ABLATION: SHX5394

## 2012-04-25 LAB — BASIC METABOLIC PANEL
BUN: 11 mg/dL (ref 6–23)
CO2: 27 mEq/L (ref 19–32)
Calcium: 9.4 mg/dL (ref 8.4–10.5)
GFR calc non Af Amer: >90 mL/min (ref >90)
Glucose, Bld: 96 mg/dL (ref 70–99)
Sodium: 138 mEq/L (ref 135–145)

## 2012-04-25 SURGERY — NOVASURE ABLATION
Anesthesia: General | Site: Vagina | Wound class: Clean Contaminated

## 2012-04-25 MED ORDER — BUPIVACAINE HCL (PF) 0.5 % IJ SOLN
INTRAMUSCULAR | Status: DC | PRN
  Administered 2012-04-25: 30 mL

## 2012-04-25 MED ORDER — MIDAZOLAM HCL 5 MG/5ML IJ SOLN
INTRAMUSCULAR | Status: DC | PRN
  Administered 2012-04-25: 2 mg via INTRAVENOUS

## 2012-04-25 MED ORDER — MIDAZOLAM HCL 2 MG/2ML IJ SOLN
INTRAMUSCULAR | Status: AC
  Filled 2012-04-25: qty 2

## 2012-04-25 MED ORDER — CEFAZOLIN SODIUM-DEXTROSE 2-3 GM-% IV SOLR
2.0000 g | INTRAVENOUS | Status: AC
  Administered 2012-04-25: 2 g via INTRAVENOUS

## 2012-04-25 MED ORDER — LIDOCAINE HCL (CARDIAC) 20 MG/ML IV SOLN
INTRAVENOUS | Status: DC | PRN
  Administered 2012-04-25: 50 mg via INTRAVENOUS

## 2012-04-25 MED ORDER — FENTANYL CITRATE 0.05 MG/ML IJ SOLN
INTRAMUSCULAR | Status: AC
  Filled 2012-04-25: qty 5

## 2012-04-25 MED ORDER — OXYCODONE-ACETAMINOPHEN 5-325 MG PO TABS: 1.0000 | ORAL_TABLET | ORAL | Status: DC | PRN

## 2012-04-25 MED ORDER — LIDOCAINE HCL (CARDIAC) 20 MG/ML IV SOLN
INTRAVENOUS | Status: AC
  Filled 2012-04-25: qty 5

## 2012-04-25 MED ORDER — FENTANYL CITRATE 0.05 MG/ML IJ SOLN
INTRAMUSCULAR | Status: DC | PRN
  Administered 2012-04-25 (×2): 50 ug via INTRAVENOUS
  Administered 2012-04-25: 100 ug via INTRAVENOUS
  Administered 2012-04-25: 50 ug via INTRAVENOUS

## 2012-04-25 MED ORDER — CEFAZOLIN SODIUM-DEXTROSE 2-3 GM-% IV SOLR
INTRAVENOUS | Status: AC
  Filled 2012-04-25: qty 50

## 2012-04-25 MED ORDER — ONDANSETRON HCL 4 MG/2ML IJ SOLN: 4.0000 mg | Freq: Once | INTRAMUSCULAR | Status: DC | PRN

## 2012-04-25 MED ORDER — FENTANYL CITRATE 0.05 MG/ML IJ SOLN
25.0000 ug | INTRAMUSCULAR | Status: DC | PRN
  Administered 2012-04-25: 25 ug via INTRAVENOUS

## 2012-04-25 MED ORDER — LACTATED RINGERS IV SOLN
INTRAVENOUS | Status: DC
  Administered 2012-04-25 (×3): via INTRAVENOUS

## 2012-04-25 MED ORDER — FENTANYL CITRATE 0.05 MG/ML IJ SOLN
INTRAMUSCULAR | Status: AC
  Filled 2012-04-25: qty 2

## 2012-04-25 MED ORDER — IBUPROFEN 800 MG PO TABS: 800.0000 mg | ORAL_TABLET | Freq: Three times a day (TID) | ORAL | Status: DC | PRN

## 2012-04-25 MED ORDER — BUPIVACAINE HCL (PF) 0.5 % IJ SOLN
INTRAMUSCULAR | Status: AC
  Filled 2012-04-25: qty 30

## 2012-04-25 MED ORDER — PROPOFOL 10 MG/ML IV EMUL
INTRAVENOUS | Status: AC
  Filled 2012-04-25: qty 20

## 2012-04-25 MED ORDER — KETOROLAC TROMETHAMINE 30 MG/ML IJ SOLN
15.0000 mg | Freq: Once | INTRAMUSCULAR | Status: AC | PRN
  Administered 2012-04-25: 30 mg via INTRAVENOUS

## 2012-04-25 MED ORDER — DEXAMETHASONE SODIUM PHOSPHATE 10 MG/ML IJ SOLN
INTRAMUSCULAR | Status: AC
  Filled 2012-04-25: qty 1

## 2012-04-25 MED ORDER — ONDANSETRON HCL 4 MG/2ML IJ SOLN
INTRAMUSCULAR | Status: AC
  Filled 2012-04-25: qty 2

## 2012-04-25 MED ORDER — KETOROLAC TROMETHAMINE 30 MG/ML IJ SOLN
INTRAMUSCULAR | Status: AC
  Filled 2012-04-25: qty 1

## 2012-04-25 MED ORDER — DEXAMETHASONE SODIUM PHOSPHATE 10 MG/ML IJ SOLN
INTRAMUSCULAR | Status: DC | PRN
  Administered 2012-04-25: 10 mg via INTRAVENOUS

## 2012-04-25 MED ORDER — PROPOFOL 10 MG/ML IV EMUL
INTRAVENOUS | Status: DC | PRN
  Administered 2012-04-25: 180 mg via INTRAVENOUS

## 2012-04-25 MED ORDER — ONDANSETRON HCL 4 MG/2ML IJ SOLN
INTRAMUSCULAR | Status: DC | PRN
  Administered 2012-04-25: 4 mg via INTRAVENOUS

## 2012-04-25 MED ORDER — MEPERIDINE HCL 25 MG/ML IJ SOLN: 6.2500 mg | INTRAMUSCULAR | Status: DC | PRN

## 2012-04-25 SURGICAL SUPPLY — 14 items
ABLATOR ENDOMETRIAL BIPOLAR (ABLATOR) ×2 IMPLANT
CATH ROBINSON RED A/P 16FR (CATHETERS) ×2 IMPLANT
CLOTH BEACON ORANGE TIMEOUT ST (SAFETY) ×2 IMPLANT
GLOVE BIO SURGEON STRL SZ 6.5 (GLOVE) ×4 IMPLANT
GLOVE BIOGEL PI IND STRL 6.5 (GLOVE) ×2 IMPLANT
GLOVE BIOGEL PI INDICATOR 6.5 (GLOVE) ×2
GLOVE ECLIPSE 6.0 STRL STRAW (GLOVE) ×4 IMPLANT
GOWN PREVENTION PLUS LG XLONG (DISPOSABLE) ×4 IMPLANT
NEEDLE SPNL 22GX3.5 QUINCKE BK (NEEDLE) ×2 IMPLANT
PACK VAGINAL MINOR WOMEN LF (CUSTOM PROCEDURE TRAY) ×2 IMPLANT
PAD OB MATERNITY 4.3X12.25 (PERSONAL CARE ITEMS) ×2 IMPLANT
SYR CONTROL 10ML LL (SYRINGE) ×2 IMPLANT
TOWEL OR 17X24 6PK STRL BLUE (TOWEL DISPOSABLE) ×4 IMPLANT
WATER STERILE IRR 1000ML POUR (IV SOLUTION) ×2 IMPLANT

## 2012-04-25 NOTE — H&P (Signed)
Natalie Gordon is an 41 y.o. MW female. She is here for an endometrial ablation due to her increasingly heavy periods. They now last about 7 days each month, so heavy that "It runs down my legs. I'm leaving DNA everywhere". She sometimes has to leave work due to the bleeding. Sometimes bleeds through her clothing.  Pertinent Gynecological History: Menses: flow is excessive with use of 10+ pads or tampons on heaviest days Bleeding: heavy Contraception:BTL  DES exposure: denies Blood transfusions: none Sexually transmitted diseases: no past history Previous GYN Procedures: endometrial biopsy normal  Last mammogram: normal Date: 12/13 Last pap: normal Date: 2013 OB History: G2, P2   Menstrual History: Menarche age: 89 Patient's last menstrual period was 04/16/2012.    Past Medical History  Diagnosis Date  . Hypertension   . Irregular menstrual cycle   . Anxiety   . History of abnormal Pap smear     At age 61  . Morbid obesity   . GERD (gastroesophageal reflux disease)     Past Surgical History  Procedure Date  . Tubal ligation 2006  . Gallbladder surgery   . Colposcopy vulva     abnormal cell.  . Cholecystectomy     Family History  Problem Relation Age of Onset  . Arthritis Mother   . Cancer Mother     ovarian  . Hypertension Mother   . Depression Mother   . Heart disease Father     heart attack  . Ulcers Maternal Grandmother     bleeding ulcers  . Hypertension Maternal Grandmother   . Heart disease Maternal Grandfather     heart attack  . Diabetes Maternal Grandfather   . Asthma Maternal Grandfather   . Dementia Maternal Grandfather   . Dementia Paternal Grandmother   . Heart disease Paternal Grandfather     heart attack    Social History:  reports that she has quit smoking. She does not have any smokeless tobacco history on file. She reports that she drinks alcohol. She reports that she does not use illicit drugs.  Allergies: No Known  Allergies  Prescriptions prior to admission  Medication Sig Dispense Refill  . hydrochlorothiazide (HYDRODIURIL) 25 MG tablet Take 1 tablet (25 mg total) by mouth daily.  30 tablet  6  . omeprazole (PRILOSEC) 20 MG capsule Take 20 mg by mouth daily.      Marland Kitchen ibuprofen (ADVIL,MOTRIN) 200 MG tablet Take 400 mg by mouth 2 (two) times daily as needed.      . Multiple Vitamin (MULTIVITAMIN WITH MINERALS) TABS Take 1 tablet by mouth daily.      . naproxen sodium (ANAPROX) 220 MG tablet Take 440 mg by mouth daily as needed.        ROS Married for 3 months, no dyspareunia. Environmental health practitioner at [employer].  Blood pressure 142/82, pulse 84, temperature 98.1 F (36.7 C), temperature source Oral, resp. rate 18, last menstrual period 04/16/2012, SpO2 98.00%. Physical Exam Heart- rrr Lungs- CTAB Abd- benign  Results for orders placed during the hospital encounter of 04/25/12 (from the past 24 hour(s))  PREGNANCY, URINE     Status: Normal   Collection Time   04/25/12  8:00 AM      Component Value Range   Preg Test, Ur NEGATIVE  NEGATIVE    No results found.  Assessment/Plan: Menorrhagia- She declines Mirena and wishes to have an ablation. I have quoted her a satisfaction rate of 90+% and an amenorrhea rate of 40%.  Bartlett Enke C. 04/25/2012, 8:53 AM

## 2012-04-25 NOTE — Anesthesia Postprocedure Evaluation (Signed)
Anesthesia Post Note  Patient: Natalie Gordon  Procedure(s) Performed: Procedure(s) (LRB): NOVASURE ABLATION (N/A)  Anesthesia type: General  Patient location: PACU  Post pain: Pain level controlled  Post assessment: Post-op Vital signs reviewed  Last Vitals:  Filed Vitals:   04/25/12 1000  BP: 120/64  Pulse: 85  Temp:   Resp: 12    Post vital signs: Reviewed  Level of consciousness: sedated  Complications: No apparent anesthesia complicationsfj

## 2012-04-25 NOTE — Op Note (Signed)
04/25/2012  9:42 AM  PATIENT:  Natalie Gordon  41 y.o. female  PRE-OPERATIVE DIAGNOSIS:  Menorrhagia  POST-OPERATIVE DIAGNOSIS:  Menorrhagia  PROCEDURE:  Procedure(s) (LRB) with comments: NOVASURE ABLATION (N/A)  SURGEON:  Surgeon(s) and Role:    * Allie Bossier, MD - Primary  PHYSICIAN ASSISTANT:   ASSISTANTS: none   ANESTHESIA:   general  EBL:  Total I/O In: 1000 [I.V.:1000] Out: 55 [Urine:5; Blood:50]  BLOOD ADMINISTERED:none  DRAINS: none   LOCAL MEDICATIONS USED:  MARCAINE     SPECIMEN:  No Specimen  DISPOSITION OF SPECIMEN:  N/A  COUNTS:  YES  TOURNIQUET:  * No tourniquets in log *  DICTATION: .Dragon Dictation  PLAN OF CARE: Discharge to home after PACU  PATIENT DISPOSITION:  PACU - hemodynamically stable.   Delay start of Pharmacological VTE agent (>24hrs) due to surgical blood loss or risk of bleeding: not applicable     The risks, benefits, and alternatives of surgery were explained, understood, and accepted. I quoted her a 90% satisfaction rate for the NovaSure endometrial ablation. All questions were answered. She was taken to the operating room and placed in the dorsal lithotomy position. LMA anesthesia was applied without complication. Her vagina was prepped and draped in the usual sterile fashion. Her bladder was emptied with a Robinson catheter for approximately 75 mL. A bimanual exam revealed a normal size and shaped mobile uterus is anteverted. Her adnexa were non-enlarged. Second degree uterine prolapse is noted. A weighted speculum was placed posteriorly and the anterior lip of her cervix was grasped with a single-tooth tenaculum. A paracervical block was performed using 30 mL of 0.5% Marcaine. Her uterus sounded to 9 cm. Her cervical length measured 5 cm. This gives her uterine cavity length of 4 cm. The cervix was gently dilated with Hegar dilators to accommodate the NovaSure device. The arms of the device was deployed and the uterine cavity  width measured 4cm. The device passed its test. An it ran for 2 minutes. I removed the tenaculum and no bleeding was noted. The NovaSure device was removed and no bleeding was noted from the endocervix. She was extubated and taken to the recovery room in stable condition. She tolerated the procedure well.

## 2012-04-25 NOTE — Transfer of Care (Signed)
Immediate Anesthesia Transfer of Care Note  Patient: Natalie Gordon  Procedure(s) Performed: Procedure(s) (LRB) with comments: NOVASURE ABLATION (N/A)  Patient Location: PACU  Anesthesia Type:General  Level of Consciousness: sedated  Airway & Oxygen Therapy: Patient Spontanous Breathing and Patient connected to nasal cannula oxygen  Post-op Assessment: Report given to PACU RN  Post vital signs: Reviewed and stable  Complications: No apparent anesthesia complications

## 2012-04-25 NOTE — Anesthesia Preprocedure Evaluation (Signed)
Anesthesia Evaluation  Patient identified by MRN, date of birth, ID band Patient awake    Reviewed: Allergy & Precautions, H&P , NPO status , Patient's Chart, lab work & pertinent test results  Airway Mallampati: III TM Distance: >3 FB Neck ROM: full    Dental No notable dental hx. (+) Teeth Intact   Pulmonary neg pulmonary ROS,    Pulmonary exam normal       Cardiovascular hypertension, Pt. on medications     Neuro/Psych negative neurological ROS     GI/Hepatic negative GI ROS, Neg liver ROS,   Endo/Other  Morbid obesity  Renal/GU negative Renal ROS  negative genitourinary   Musculoskeletal negative musculoskeletal ROS (+)   Abdominal (+) + obese,   Peds negative pediatric ROS (+)  Hematology negative hematology ROS (+)   Anesthesia Other Findings   Reproductive/Obstetrics negative OB ROS                           Anesthesia Physical Anesthesia Plan  ASA: III  Anesthesia Plan: General   Post-op Pain Management:    Induction: Intravenous  Airway Management Planned: LMA  Additional Equipment:   Intra-op Plan:   Post-operative Plan:   Informed Consent: I have reviewed the patients History and Physical, chart, labs and discussed the procedure including the risks, benefits and alternatives for the proposed anesthesia with the patient or authorized representative who has indicated his/her understanding and acceptance.     Plan Discussed with: CRNA and Surgeon  Anesthesia Plan Comments:         Anesthesia Quick Evaluation

## 2012-04-26 ENCOUNTER — Encounter (HOSPITAL_COMMUNITY): Payer: Self-pay | Admitting: Obstetrics & Gynecology

## 2012-05-07 ENCOUNTER — Encounter: Payer: Self-pay | Admitting: Obstetrics and Gynecology

## 2012-05-29 ENCOUNTER — Ambulatory Visit (INDEPENDENT_AMBULATORY_CARE_PROVIDER_SITE_OTHER): Payer: PRIVATE HEALTH INSURANCE | Admitting: Family Medicine

## 2012-05-29 ENCOUNTER — Encounter: Payer: Self-pay | Admitting: Family Medicine

## 2012-05-29 VITALS — BP 137/93 | HR 86 | Ht 64.0 in | Wt 235.0 lb

## 2012-05-29 DIAGNOSIS — N938 Other specified abnormal uterine and vaginal bleeding: Secondary | ICD-10-CM

## 2012-05-29 DIAGNOSIS — N949 Unspecified condition associated with female genital organs and menstrual cycle: Secondary | ICD-10-CM

## 2012-05-29 NOTE — Assessment & Plan Note (Signed)
S/P Ablation--doing well--return if cycle is not improved.

## 2012-05-29 NOTE — Progress Notes (Signed)
  Subjective:    Patient ID: Natalie Gordon, female    DOB: 31-Aug-1970, 42 y.o.   MRN: [id number]  HPI  Post op s/p Novasure ablation.  Having cycle (1st) right now--is light.  No other complaints.  Review of Systems  Constitutional: Negative for fever and chills.  Genitourinary: Negative for vaginal discharge.       Objective:   Physical Exam  Vitals reviewed. Constitutional: She appears well-developed and well-nourished.  Cardiovascular: Normal rate.   Pulmonary/Chest: She is in respiratory distress.  Abdominal: Soft. There is no tenderness.  Genitourinary: Uterus normal. Uterus is not enlarged and not tender. Right adnexum displays no mass and no tenderness. Left adnexum displays no mass and no tenderness.          Assessment & Plan:

## 2012-05-29 NOTE — Patient Instructions (Signed)

## 2013-03-14 ENCOUNTER — Other Ambulatory Visit: Payer: Self-pay

## 2013-05-07 ENCOUNTER — Ambulatory Visit: Payer: PRIVATE HEALTH INSURANCE | Admitting: Family Medicine

## 2013-05-14 ENCOUNTER — Encounter: Payer: Self-pay | Admitting: Family Medicine

## 2013-05-14 ENCOUNTER — Ambulatory Visit (INDEPENDENT_AMBULATORY_CARE_PROVIDER_SITE_OTHER): Payer: BC Managed Care – PPO | Admitting: Family Medicine

## 2013-05-14 VITALS — BP 139/94 | HR 80 | Ht 64.0 in | Wt 236.0 lb

## 2013-05-14 DIAGNOSIS — E669 Obesity, unspecified: Secondary | ICD-10-CM

## 2013-05-14 DIAGNOSIS — Z01419 Encounter for gynecological examination (general) (routine) without abnormal findings: Secondary | ICD-10-CM

## 2013-05-14 DIAGNOSIS — Z1239 Encounter for other screening for malignant neoplasm of breast: Secondary | ICD-10-CM

## 2013-05-14 DIAGNOSIS — Z124 Encounter for screening for malignant neoplasm of cervix: Secondary | ICD-10-CM

## 2013-05-14 DIAGNOSIS — R030 Elevated blood-pressure reading, without diagnosis of hypertension: Secondary | ICD-10-CM

## 2013-05-14 DIAGNOSIS — IMO0001 Reserved for inherently not codable concepts without codable children: Secondary | ICD-10-CM

## 2013-05-14 DIAGNOSIS — Z1151 Encounter for screening for human papillomavirus (HPV): Secondary | ICD-10-CM

## 2013-05-14 MED ORDER — PHENTERMINE HCL 37.5 MG PO CAPS
37.5000 mg | ORAL_CAPSULE | ORAL | Status: DC
Start: 2013-05-14 — End: 2013-07-03

## 2013-05-14 NOTE — Progress Notes (Signed)
  Subjective:     Natalie Gordon is a 43 y.o. female and is here for a comprehensive physical exam. The patient reports problems - weight gain. She really wants a boost to help her.  Knows she is eating unhealthy and over-eating and not exercising.  She is interested in Phentermine.  Especially given how high her BP is.  History   Social History  . Marital Status: Married    Spouse Name: N/A    Number of Children: N/A  . Years of Education: N/A   Occupational History  . Not on file.   Social History Main Topics  . Smoking status: Former Research scientist (life sciences)  . Smokeless tobacco: Not on file     Comment: quit x 77yrs ago  . Alcohol Use: Yes     Comment: social  . Drug Use: No  . Sexual Activity: Yes    Partners: Male    Birth Control/ Protection: Surgical     Comment: tubaligation.   Other Topics Concern  . Not on file   Social History Narrative  . No narrative on file   Health Maintenance  Topic Date Due  . Tetanus/tdap  05/19/1990  . Influenza Vaccine  12/07/2012  . Pap Smear  03/14/2015    The following portions of the patient's history were reviewed and updated as appropriate: allergies, current medications, past family history, past medical history, past social history, past surgical history and problem list.  Review of Systems Pertinent items are noted in HPI.   Objective:    BP 139/94  Pulse 80  Ht 5\' 4"  (1.626 m)  Wt 236 lb (107.049 kg)  BMI 40.49 kg/m2 General appearance: alert, cooperative, appears stated age and moderately obese Head: Normocephalic, without obvious abnormality, atraumatic Neck: no adenopathy, supple, symmetrical, trachea midline and thyroid not enlarged, symmetric, no tenderness/mass/nodules Lungs: clear to auscultation bilaterally Breasts: normal appearance, no masses or tenderness Heart: regular rate and rhythm, S1, S2 normal, no murmur, click, rub or gallop Abdomen: soft, non-tender; bowel sounds normal; no masses,  no organomegaly Pelvic:  cervix normal in appearance, external genitalia normal, no adnexal masses or tenderness, no cervical motion tenderness, uterus normal size, shape, and consistency and vagina normal without discharge Extremities: extremities normal, atraumatic, no cyanosis or edema Pulses: 2+ and symmetric Skin: Skin color, texture, turgor normal. No rashes or lesions Lymph nodes: Cervical, supraclavicular, and axillary nodes normal. Neurologic: Grossly normal    Assessment:    Healthy female exam. Obesity and elevated BP      Plan:    Pap today Mammogram Annual blood work through work Flu shot already given See problem list See After Visit Summary for Counseling Recommendations

## 2013-05-15 DIAGNOSIS — R030 Elevated blood-pressure reading, without diagnosis of hypertension: Secondary | ICD-10-CM | POA: Insufficient documentation

## 2013-05-15 NOTE — Assessment & Plan Note (Signed)
Have given a 2 month supply of Phentermine.  Risks discussed.

## 2013-05-15 NOTE — Assessment & Plan Note (Signed)
Check BP routinely with RN at [facility] while on Phentermine.

## 2013-07-03 ENCOUNTER — Other Ambulatory Visit: Payer: Self-pay | Admitting: Family Medicine

## 2013-07-03 ENCOUNTER — Other Ambulatory Visit: Payer: Self-pay | Admitting: *Deleted

## 2013-07-03 DIAGNOSIS — E669 Obesity, unspecified: Secondary | ICD-10-CM

## 2013-07-03 MED ORDER — PHENTERMINE HCL 37.5 MG PO CAPS: 37.5000 mg | ORAL_CAPSULE | ORAL | Status: DC

## 2013-07-03 NOTE — Telephone Encounter (Signed)
Pt called and needed a refill.  Per Dr. Kennon Rounds, can refill 1 time only.  I have called it into pt pharmacy.  Pt aware.

## 2013-08-22 ENCOUNTER — Ambulatory Visit: Payer: Self-pay | Admitting: Family Medicine

## 2014-03-10 ENCOUNTER — Encounter: Payer: Self-pay | Admitting: Family Medicine

## 2014-05-20 ENCOUNTER — Ambulatory Visit (INDEPENDENT_AMBULATORY_CARE_PROVIDER_SITE_OTHER): Payer: BLUE CROSS/BLUE SHIELD | Admitting: Family Medicine

## 2014-05-20 ENCOUNTER — Encounter: Payer: Self-pay | Admitting: Family Medicine

## 2014-05-20 VITALS — BP 142/93 | HR 83 | Ht 64.0 in | Wt 215.0 lb

## 2014-05-20 DIAGNOSIS — Z01419 Encounter for gynecological examination (general) (routine) without abnormal findings: Secondary | ICD-10-CM

## 2014-05-20 DIAGNOSIS — E669 Obesity, unspecified: Secondary | ICD-10-CM

## 2014-05-20 MED ORDER — PHENTERMINE HCL 37.5 MG PO CAPS: 37.5000 mg | ORAL_CAPSULE | Freq: Every morning | ORAL | Status: DC

## 2014-05-20 NOTE — Patient Instructions (Addendum)
Preventive Care for Adults A healthy lifestyle and preventive care can promote health and wellness. Preventive health guidelines for women include the following key practices.  A routine yearly physical is a good way to check with your health care provider about your health and preventive screening. It is a chance to share any concerns and updates on your health and to receive a thorough exam.  Visit your dentist for a routine exam and preventive care every 6 months. Brush your teeth twice a day and floss once a day. Good oral hygiene prevents tooth decay and gum disease.  The frequency of eye exams is based on your age, health, family medical history, use of contact lenses, and other factors. Follow your health care provider's recommendations for frequency of eye exams.  Eat a healthy diet. Foods like vegetables, fruits, whole grains, low-fat dairy products, and lean protein foods contain the nutrients you need without too many calories. Decrease your intake of foods high in solid fats, added sugars, and salt. Eat the right amount of calories for you.Get information about a proper diet from your health care provider, if necessary.  Regular physical exercise is one of the most important things you can do for your health. Most adults should get at least 150 minutes of moderate-intensity exercise (any activity that increases your heart rate and causes you to sweat) each week. In addition, most adults need muscle-strengthening exercises on 2 or more days a week.  Maintain a healthy weight. The body mass index (BMI) is a screening tool to identify possible weight problems. It provides an estimate of body fat based on height and weight. Your health care provider can find your BMI and can help you achieve or maintain a healthy weight.For adults 20 years and older:  A BMI below 18.5 is considered underweight.  A BMI of 18.5 to 24.9 is normal.  A BMI of 25 to 29.9 is considered overweight.  A BMI of  30 and above is considered obese.  Maintain normal blood lipids and cholesterol levels by exercising and minimizing your intake of saturated fat. Eat a balanced diet with plenty of fruit and vegetables. Blood tests for lipids and cholesterol should begin at age 76 and be repeated every 5 years. If your lipid or cholesterol levels are high, you are over 50, or you are at high risk for heart disease, you may need your cholesterol levels checked more frequently.Ongoing high lipid and cholesterol levels should be treated with medicines if diet and exercise are not working.  If you smoke, find out from your health care provider how to quit. If you do not use tobacco, do not start.  Lung cancer screening is recommended for adults aged 22-80 years who are at high risk for developing lung cancer because of a history of smoking. A yearly low-dose CT scan of the lungs is recommended for people who have at least a 30-pack-year history of smoking and are a current smoker or have quit within the past 15 years. A pack year of smoking is smoking an average of 1 pack of cigarettes a day for 1 year (for example: 1 pack a day for 30 years or 2 packs a day for 15 years). Yearly screening should continue until the smoker has stopped smoking for at least 15 years. Yearly screening should be stopped for people who develop a health problem that would prevent them from having lung cancer treatment.  If you are pregnant, do not drink alcohol. If you are breastfeeding,  be very cautious about drinking alcohol. If you are not pregnant and choose to drink alcohol, do not have more than 1 drink per day. One drink is considered to be 12 ounces (355 mL) of beer, 5 ounces (148 mL) of wine, or 1.5 ounces (44 mL) of liquor.  Avoid use of street drugs. Do not share needles with anyone. Ask for help if you need support or instructions about stopping the use of drugs.  High blood pressure causes heart disease and increases the risk of  stroke. Your blood pressure should be checked at least every 1 to 2 years. Ongoing high blood pressure should be treated with medicines if weight loss and exercise do not work.  If you are 75-52 years old, ask your health care provider if you should take aspirin to prevent strokes.  Diabetes screening involves taking a blood sample to check your fasting blood sugar level. This should be done once every 3 years, after age 15, if you are within normal weight and without risk factors for diabetes. Testing should be considered at a younger age or be carried out more frequently if you are overweight and have at least 1 risk factor for diabetes.  Breast cancer screening is essential preventive care for women. You should practice "breast self-awareness." This means understanding the normal appearance and feel of your breasts and may include breast self-examination. Any changes detected, no matter how small, should be reported to a health care provider. Women in their 58s and 30s should have a clinical breast exam (CBE) by a health care provider as part of a regular health exam every 1 to 3 years. After age 16, women should have a CBE every year. Starting at age 53, women should consider having a mammogram (breast X-ray test) every year. Women who have a family history of breast cancer should talk to their health care provider about genetic screening. Women at a high risk of breast cancer should talk to their health care providers about having an MRI and a mammogram every year.  Breast cancer gene (BRCA)-related cancer risk assessment is recommended for women who have family members with BRCA-related cancers. BRCA-related cancers include breast, ovarian, tubal, and peritoneal cancers. Having family members with these cancers may be associated with an increased risk for harmful changes (mutations) in the breast cancer genes BRCA1 and BRCA2. Results of the assessment will determine the need for genetic counseling and  BRCA1 and BRCA2 testing.  Routine pelvic exams to screen for cancer are no longer recommended for nonpregnant women who are considered low risk for cancer of the pelvic organs (ovaries, uterus, and vagina) and who do not have symptoms. Ask your health care provider if a screening pelvic exam is right for you.  If you have had past treatment for cervical cancer or a condition that could lead to cancer, you need Pap tests and screening for cancer for at least 20 years after your treatment. If Pap tests have been discontinued, your risk factors (such as having a new sexual partner) need to be reassessed to determine if screening should be resumed. Some women have medical problems that increase the chance of getting cervical cancer. In these cases, your health care provider may recommend more frequent screening and Pap tests.  The HPV test is an additional test that may be used for cervical cancer screening. The HPV test looks for the virus that can cause the cell changes on the cervix. The cells collected during the Pap test can be  tested for HPV. The HPV test could be used to screen women aged 30 years and older, and should be used in women of any age who have unclear Pap test results. After the age of 30, women should have HPV testing at the same frequency as a Pap test.  Colorectal cancer can be detected and often prevented. Most routine colorectal cancer screening begins at the age of 50 years and continues through age 75 years. However, your health care provider may recommend screening at an earlier age if you have risk factors for colon cancer. On a yearly basis, your health care provider may provide home test kits to check for hidden blood in the stool. Use of a small camera at the end of a tube, to directly examine the colon (sigmoidoscopy or colonoscopy), can detect the earliest forms of colorectal cancer. Talk to your health care provider about this at age 50, when routine screening begins. Direct  exam of the colon should be repeated every 5-10 years through age 75 years, unless early forms of pre-cancerous polyps or small growths are found.  People who are at an increased risk for hepatitis B should be screened for this virus. You are considered at high risk for hepatitis B if:  You were born in a country where hepatitis B occurs often. Talk with your health care provider about which countries are considered high risk.  Your parents were born in a high-risk country and you have not received a shot to protect against hepatitis B (hepatitis B vaccine).  You have HIV or AIDS.  You use needles to inject street drugs.  You live with, or have sex with, someone who has hepatitis B.  You get hemodialysis treatment.  You take certain medicines for conditions like cancer, organ transplantation, and autoimmune conditions.  Hepatitis C blood testing is recommended for all people born from 1945 through 1965 and any individual with known risks for hepatitis C.  Practice safe sex. Use condoms and avoid high-risk sexual practices to reduce the spread of sexually transmitted infections (STIs). STIs include gonorrhea, chlamydia, syphilis, trichomonas, herpes, HPV, and human immunodeficiency virus (HIV). Herpes, HIV, and HPV are viral illnesses that have no cure. They can result in disability, cancer, and death.  You should be screened for sexually transmitted illnesses (STIs) including gonorrhea and chlamydia if:  You are sexually active and are younger than 24 years.  You are older than 24 years and your health care provider tells you that you are at risk for this type of infection.  Your sexual activity has changed since you were last screened and you are at an increased risk for chlamydia or gonorrhea. Ask your health care provider if you are at risk.  If you are at risk of being infected with HIV, it is recommended that you take a prescription medicine daily to prevent HIV infection. This is  called preexposure prophylaxis (PrEP). You are considered at risk if:  You are a heterosexual woman, are sexually active, and are at increased risk for HIV infection.  You take drugs by injection.  You are sexually active with a partner who has HIV.  Talk with your health care provider about whether you are at high risk of being infected with HIV. If you choose to begin PrEP, you should first be tested for HIV. You should then be tested every 3 months for as long as you are taking PrEP.  Osteoporosis is a disease in which the bones lose minerals and strength   with aging. This can result in serious bone fractures or breaks. The risk of osteoporosis can be identified using a bone density scan. Women ages 65 years and over and women at risk for fractures or osteoporosis should discuss screening with their health care providers. Ask your health care provider whether you should take a calcium supplement or vitamin D to reduce the rate of osteoporosis.  Menopause can be associated with physical symptoms and risks. Hormone replacement therapy is available to decrease symptoms and risks. You should talk to your health care provider about whether hormone replacement therapy is right for you.  Use sunscreen. Apply sunscreen liberally and repeatedly throughout the day. You should seek shade when your shadow is shorter than you. Protect yourself by wearing long sleeves, pants, a wide-brimmed hat, and sunglasses year round, whenever you are outdoors.  Once a month, do a whole body skin exam, using a mirror to look at the skin on your back. Tell your health care provider of new moles, moles that have irregular borders, moles that are larger than a pencil eraser, or moles that have changed in shape or color.  Stay current with required vaccines (immunizations).  Influenza vaccine. All adults should be immunized every year.  Tetanus, diphtheria, and acellular pertussis (Td, Tdap) vaccine. Pregnant women should  receive 1 dose of Tdap vaccine during each pregnancy. The dose should be obtained regardless of the length of time since the last dose. Immunization is preferred during the 27th-36th week of gestation. An adult who has not previously received Tdap or who does not know her vaccine status should receive 1 dose of Tdap. This initial dose should be followed by tetanus and diphtheria toxoids (Td) booster doses every 10 years. Adults with an unknown or incomplete history of completing a 3-dose immunization series with Td-containing vaccines should begin or complete a primary immunization series including a Tdap dose. Adults should receive a Td booster every 10 years.  Varicella vaccine. An adult without evidence of immunity to varicella should receive 2 doses or a second dose if she has previously received 1 dose. Pregnant females who do not have evidence of immunity should receive the first dose after pregnancy. This first dose should be obtained before leaving the health care facility. The second dose should be obtained 4-8 weeks after the first dose.  Human papillomavirus (HPV) vaccine. Females aged 13-26 years who have not received the vaccine previously should obtain the 3-dose series. The vaccine is not recommended for use in pregnant females. However, pregnancy testing is not needed before receiving a dose. If a female is found to be pregnant after receiving a dose, no treatment is needed. In that case, the remaining doses should be delayed until after the pregnancy. Immunization is recommended for any person with an immunocompromised condition through the age of 26 years if she did not get any or all doses earlier. During the 3-dose series, the second dose should be obtained 4-8 weeks after the first dose. The third dose should be obtained 24 weeks after the first dose and 16 weeks after the second dose.  Zoster vaccine. One dose is recommended for adults aged 60 years or older unless certain conditions are  present.  Measles, mumps, and rubella (MMR) vaccine. Adults born before 1957 generally are considered immune to measles and mumps. Adults born in 1957 or later should have 1 or more doses of MMR vaccine unless there is a contraindication to the vaccine or there is laboratory evidence of immunity to   each of the three diseases. A routine second dose of MMR vaccine should be obtained at least 28 days after the first dose for students attending postsecondary schools, health care workers, or international travelers. People who received inactivated measles vaccine or an unknown type of measles vaccine during 1963-1967 should receive 2 doses of MMR vaccine. People who received inactivated mumps vaccine or an unknown type of mumps vaccine before 1979 and are at high risk for mumps infection should consider immunization with 2 doses of MMR vaccine. For females of childbearing age, rubella immunity should be determined. If there is no evidence of immunity, females who are not pregnant should be vaccinated. If there is no evidence of immunity, females who are pregnant should delay immunization until after pregnancy. Unvaccinated health care workers born before 1957 who lack laboratory evidence of measles, mumps, or rubella immunity or laboratory confirmation of disease should consider measles and mumps immunization with 2 doses of MMR vaccine or rubella immunization with 1 dose of MMR vaccine.  Pneumococcal 13-valent conjugate (PCV13) vaccine. When indicated, a person who is uncertain of her immunization history and has no record of immunization should receive the PCV13 vaccine. An adult aged 19 years or older who has certain medical conditions and has not been previously immunized should receive 1 dose of PCV13 vaccine. This PCV13 should be followed with a dose of pneumococcal polysaccharide (PPSV23) vaccine. The PPSV23 vaccine dose should be obtained at least 8 weeks after the dose of PCV13 vaccine. An adult aged 19  years or older who has certain medical conditions and previously received 1 or more doses of PPSV23 vaccine should receive 1 dose of PCV13. The PCV13 vaccine dose should be obtained 1 or more years after the last PPSV23 vaccine dose.  Pneumococcal polysaccharide (PPSV23) vaccine. When PCV13 is also indicated, PCV13 should be obtained first. All adults aged 65 years and older should be immunized. An adult younger than age 65 years who has certain medical conditions should be immunized. Any person who resides in a nursing home or long-term care facility should be immunized. An adult smoker should be immunized. People with an immunocompromised condition and certain other conditions should receive both PCV13 and PPSV23 vaccines. People with human immunodeficiency virus (HIV) infection should be immunized as soon as possible after diagnosis. Immunization during chemotherapy or radiation therapy should be avoided. Routine use of PPSV23 vaccine is not recommended for American Indians, [place] Natives, or people younger than 65 years unless there are medical conditions that require PPSV23 vaccine. When indicated, people who have unknown immunization and have no record of immunization should receive PPSV23 vaccine. One-time revaccination 5 years after the first dose of PPSV23 is recommended for people aged 19-64 years who have chronic kidney failure, nephrotic syndrome, asplenia, or immunocompromised conditions. People who received 1-2 doses of PPSV23 before age 65 years should receive another dose of PPSV23 vaccine at age 65 years or later if at least 5 years have passed since the previous dose. Doses of PPSV23 are not needed for people immunized with PPSV23 at or after age 65 years.  Meningococcal vaccine. Adults with asplenia or persistent complement component deficiencies should receive 2 doses of quadrivalent meningococcal conjugate (MenACWY-D) vaccine. The doses should be obtained at least 2 months apart.  Microbiologists working with certain meningococcal bacteria, military recruits, people at risk during an outbreak, and people who travel to or live in countries with a high rate of meningitis should be immunized. A first-year college student up through age   21 years who is living in a residence hall should receive a dose if she did not receive a dose on or after her 16th birthday. Adults who have certain high-risk conditions should receive one or more doses of vaccine.  Hepatitis A vaccine. Adults who wish to be protected from this disease, have certain high-risk conditions, work with hepatitis A-infected animals, work in hepatitis A research labs, or travel to or work in countries with a high rate of hepatitis A should be immunized. Adults who were previously unvaccinated and who anticipate close contact with an international adoptee during the first 60 days after arrival in the [place] from a country with a high rate of hepatitis A should be immunized.  Hepatitis B vaccine. Adults who wish to be protected from this disease, have certain high-risk conditions, may be exposed to blood or other infectious body fluids, are household contacts or sex partners of hepatitis B positive people, are clients or workers in certain care facilities, or travel to or work in countries with a high rate of hepatitis B should be immunized.  Haemophilus influenzae type b (Hib) vaccine. A previously unvaccinated person with asplenia or sickle cell disease or having a scheduled splenectomy should receive 1 dose of Hib vaccine. Regardless of previous immunization, a recipient of a hematopoietic stem cell transplant should receive a 3-dose series 6-12 months after her successful transplant. Hib vaccine is not recommended for adults with HIV infection. Preventive Services / Frequency Ages 64 to 68 years  Blood pressure check.** / Every 1 to 2 years.  Lipid and cholesterol check.** / Every 5 years beginning at age  22.  Clinical breast exam.** / Every 3 years for women in their 88s and 53s.  BRCA-related cancer risk assessment.** / For women who have family members with a BRCA-related cancer (breast, ovarian, tubal, or peritoneal cancers).  Pap test.** / Every 2 years from ages 90 through 51. Every 3 years starting at age 21 through age 56 or 3 with a history of 3 consecutive normal Pap tests.  HPV screening.** / Every 3 years from ages 24 through ages 1 to 46 with a history of 3 consecutive normal Pap tests.  Hepatitis C blood test.** / For any individual with known risks for hepatitis C.  Skin self-exam. / Monthly.  Influenza vaccine. / Every year.  Tetanus, diphtheria, and acellular pertussis (Tdap, Td) vaccine.** / Consult your health care provider. Pregnant women should receive 1 dose of Tdap vaccine during each pregnancy. 1 dose of Td every 10 years.  Varicella vaccine.** / Consult your health care provider. Pregnant females who do not have evidence of immunity should receive the first dose after pregnancy.  HPV vaccine. / 3 doses over 6 months, if 72 and younger. The vaccine is not recommended for use in pregnant females. However, pregnancy testing is not needed before receiving a dose.  Measles, mumps, rubella (MMR) vaccine.** / You need at least 1 dose of MMR if you were born in 1957 or later. You may also need a 2nd dose. For females of childbearing age, rubella immunity should be determined. If there is no evidence of immunity, females who are not pregnant should be vaccinated. If there is no evidence of immunity, females who are pregnant should delay immunization until after pregnancy.  Pneumococcal 13-valent conjugate (PCV13) vaccine.** / Consult your health care provider.  Pneumococcal polysaccharide (PPSV23) vaccine.** / 1 to 2 doses if you smoke cigarettes or if you have certain conditions.  Meningococcal vaccine.** /  1 dose if you are age 19 to 21 years and a first-year college  student living in a residence hall, or have one of several medical conditions, you need to get vaccinated against meningococcal disease. You may also need additional booster doses.  Hepatitis A vaccine.** / Consult your health care provider.  Hepatitis B vaccine.** / Consult your health care provider.  Haemophilus influenzae type b (Hib) vaccine.** / Consult your health care provider. Ages 40 to 64 years  Blood pressure check.** / Every 1 to 2 years.  Lipid and cholesterol check.** / Every 5 years beginning at age 20 years.  Lung cancer screening. / Every year if you are aged 55-80 years and have a 30-pack-year history of smoking and currently smoke or have quit within the past 15 years. Yearly screening is stopped once you have quit smoking for at least 15 years or develop a health problem that would prevent you from having lung cancer treatment.  Clinical breast exam.** / Every year after age 40 years.  BRCA-related cancer risk assessment.** / For women who have family members with a BRCA-related cancer (breast, ovarian, tubal, or peritoneal cancers).  Mammogram.** / Every year beginning at age 40 years and continuing for as long as you are in good health. Consult with your health care provider.  Pap test.** / Every 3 years starting at age 30 years through age 65 or 70 years with a history of 3 consecutive normal Pap tests.  HPV screening.** / Every 3 years from ages 30 years through ages 65 to 70 years with a history of 3 consecutive normal Pap tests.  Fecal occult blood test (FOBT) of stool. / Every year beginning at age 50 years and continuing until age 75 years. You may not need to do this test if you get a colonoscopy every 10 years.  Flexible sigmoidoscopy or colonoscopy.** / Every 5 years for a flexible sigmoidoscopy or every 10 years for a colonoscopy beginning at age 50 years and continuing until age 75 years.  Hepatitis C blood test.** / For all people born from 1945 through  1965 and any individual with known risks for hepatitis C.  Skin self-exam. / Monthly.  Influenza vaccine. / Every year.  Tetanus, diphtheria, and acellular pertussis (Tdap/Td) vaccine.** / Consult your health care provider. Pregnant women should receive 1 dose of Tdap vaccine during each pregnancy. 1 dose of Td every 10 years.  Varicella vaccine.** / Consult your health care provider. Pregnant females who do not have evidence of immunity should receive the first dose after pregnancy.  Zoster vaccine.** / 1 dose for adults aged 60 years or older.  Measles, mumps, rubella (MMR) vaccine.** / You need at least 1 dose of MMR if you were born in 1957 or later. You may also need a 2nd dose. For females of childbearing age, rubella immunity should be determined. If there is no evidence of immunity, females who are not pregnant should be vaccinated. If there is no evidence of immunity, females who are pregnant should delay immunization until after pregnancy.  Pneumococcal 13-valent conjugate (PCV13) vaccine.** / Consult your health care provider.  Pneumococcal polysaccharide (PPSV23) vaccine.** / 1 to 2 doses if you smoke cigarettes or if you have certain conditions.  Meningococcal vaccine.** / Consult your health care provider.  Hepatitis A vaccine.** / Consult your health care provider.  Hepatitis B vaccine.** / Consult your health care provider.  Haemophilus influenzae type b (Hib) vaccine.** / Consult your health care provider. Ages 65   years and over  Blood pressure check.** / Every 1 to 2 years.  Lipid and cholesterol check.** / Every 5 years beginning at age 27 years.  Lung cancer screening. / Every year if you are aged 20-80 years and have a 30-pack-year history of smoking and currently smoke or have quit within the past 15 years. Yearly screening is stopped once you have quit smoking for at least 15 years or develop a health problem that would prevent you from having lung cancer  treatment.  Clinical breast exam.** / Every year after age 71 years.  BRCA-related cancer risk assessment.** / For women who have family members with a BRCA-related cancer (breast, ovarian, tubal, or peritoneal cancers).  Mammogram.** / Every year beginning at age 44 years and continuing for as long as you are in good health. Consult with your health care provider.  Pap test.** / Every 3 years starting at age 31 years through age 14 or 98 years with 3 consecutive normal Pap tests. Testing can be stopped between 65 and 70 years with 3 consecutive normal Pap tests and no abnormal Pap or HPV tests in the past 10 years.  HPV screening.** / Every 3 years from ages 65 years through ages 73 or 31 years with a history of 3 consecutive normal Pap tests. Testing can be stopped between 65 and 70 years with 3 consecutive normal Pap tests and no abnormal Pap or HPV tests in the past 10 years.  Fecal occult blood test (FOBT) of stool. / Every year beginning at age 33 years and continuing until age 74 years. You may not need to do this test if you get a colonoscopy every 10 years.  Flexible sigmoidoscopy or colonoscopy.** / Every 5 years for a flexible sigmoidoscopy or every 10 years for a colonoscopy beginning at age 71 years and continuing until age 68 years.  Hepatitis C blood test.** / For all people born from [birth year] through 1964 and any individual with known risks for hepatitis C.  Osteoporosis screening.** / A one-time screening for women ages 43 years and over and women at risk for fractures or osteoporosis.  Skin self-exam. / Monthly.  Influenza vaccine. / Every year.  Tetanus, diphtheria, and acellular pertussis (Tdap/Td) vaccine.** / 1 dose of Td every 10 years.  Varicella vaccine.** / Consult your health care provider.  Zoster vaccine.** / 1 dose for adults aged 19 years or older.  Pneumococcal 13-valent conjugate (PCV13) vaccine.** / Consult your health care provider.  Pneumococcal  polysaccharide (PPSV23) vaccine.** / 1 dose for all adults aged 28 years and older.  Meningococcal vaccine.** / Consult your health care provider.  Hepatitis A vaccine.** / Consult your health care provider.  Hepatitis B vaccine.** / Consult your health care provider.  Haemophilus influenzae type b (Hib) vaccine.** / Consult your health care provider. ** Family history and personal history of risk and conditions may change your health care provider's recommendations. Document Released: 06/21/2001 Document Revised: 09/09/2013 Document Reviewed: 09/20/2010 [facility] Patient Information 2015 [place], [place]. This information is not intended to replace advice given to you by your health care provider. Make sure you discuss any questions you have with your health care provider. Calorie Counting for Weight Loss Calories are energy you get from the things you eat and drink. Your body uses this energy to keep you going throughout the day. The number of calories you eat affects your weight. When you eat more calories than your body needs, your body stores the extra calories as  fat. When you eat fewer calories than your body needs, your body burns fat to get the energy it needs. Calorie counting means keeping track of how many calories you eat and drink each day. If you make sure to eat fewer calories than your body needs, you should lose weight. In order for calorie counting to work, you will need to eat the number of calories that are right for you in a day to lose a healthy amount of weight per week. A healthy amount of weight to lose per week is usually 1-2 lb (0.5-0.9 kg). A dietitian can determine how many calories you need in a day and give you suggestions on how to reach your calorie goal.  WHAT IS MY MY PLAN? My goal is to have __________ calories per day.  If I have this many calories per day, I should lose around __________ pounds per week. WHAT DO I NEED TO KNOW ABOUT CALORIE COUNTING? In order  to meet your daily calorie goal, you will need to:  Find out how many calories are in each food you would like to eat. Try to do this before you eat.  Decide how much of the food you can eat.  Write down what you ate and how many calories it had. Doing this is called keeping a food log. WHERE DO I FIND CALORIE INFORMATION? The number of calories in a food can be found on a Nutrition Facts label. Note that all the information on a label is based on a specific serving of the food. If a food does not have a Nutrition Facts label, try to look up the calories online or ask your dietitian for help. HOW DO I DECIDE HOW MUCH TO EAT? To decide how much of the food you can eat, you will need to consider both the number of calories in one serving and the size of one serving. This information can be found on the Nutrition Facts label. If a food does not have a Nutrition Facts label, look up the information online or ask your dietitian for help. Remember that calories are listed per serving. If you choose to have more than one serving of a food, you will have to multiply the calories per serving by the amount of servings you plan to eat. For example, the label on a package of bread might say that a serving size is 1 slice and that there are 90 calories in a serving. If you eat 1 slice, you will have eaten 90 calories. If you eat 2 slices, you will have eaten 180 calories. HOW DO I KEEP A FOOD LOG? After each meal, record the following information in your food log:  What you ate.  How much of it you ate.  How many calories it had.  Then, add up your calories. Keep your food log near you, such as in a small notebook in your pocket. Another option is to use a mobile app or website. Some programs will calculate calories for you and show you how many calories you have left each time you add an item to the log. WHAT ARE SOME CALORIE COUNTING TIPS?  Use your calories on foods and drinks that will fill you up  and not leave you hungry. Some examples of this include foods like nuts and nut butters, vegetables, lean proteins, and high-fiber foods (more than 5 g fiber per serving).  Eat nutritious foods and avoid empty calories. Empty calories are calories you get from foods or  beverages that do not have many nutrients, such as candy and soda. It is better to have a nutritious high-calorie food (such as an avocado) than a food with few nutrients (such as a bag of chips).  Know how many calories are in the foods you eat most often. This way, you do not have to look up how many calories they have each time you eat them.  Look out for foods that may seem like low-calorie foods but are really high-calorie foods, such as baked goods, soda, and fat-free candy.  Pay attention to calories in drinks. Drinks such as sodas, specialty coffee drinks, alcohol, and juices have a lot of calories yet do not fill you up. Choose low-calorie drinks like water and diet drinks.  Focus your calorie counting efforts on higher calorie items. Logging the calories in a garden salad that contains only vegetables is less important than calculating the calories in a milk shake.  Find a way of tracking calories that works for you. Get creative. Most people who are successful find ways to keep track of how much they eat in a day, even if they do not count every calorie. WHAT ARE SOME PORTION CONTROL TIPS?  Know how many calories are in a serving. This will help you know how many servings of a certain food you can have.  Use a measuring cup to measure serving sizes. This is helpful when you start out. With time, you will be able to estimate serving sizes for some foods.  Take some time to put servings of different foods on your favorite plates, bowls, and cups so you know what a serving looks like.  Try not to eat straight from a bag or box. Doing this can lead to overeating. Put the amount you would like to eat in a cup or on a plate  to make sure you are eating the right portion.  Use smaller plates, glasses, and bowls to prevent overeating. This is a quick and easy way to practice portion control. If your plate is smaller, less food can fit on it.  Try not to multitask while eating, such as watching TV or using your computer. If it is time to eat, sit down at a table and enjoy your food. Doing this will help you to start recognizing when you are full. It will also make you more aware of what and how much you are eating. HOW CAN I CALORIE COUNT WHEN EATING OUT?  Ask for smaller portion sizes or child-sized portions.  Consider sharing an entree and sides instead of getting your own entree.  If you get your own entree, eat only half. Ask for a box at the beginning of your meal and put the rest of your entree in it so you are not tempted to eat it.  Look for the calories on the menu. If calories are listed, choose the lower calorie options.  Choose dishes that include vegetables, fruits, whole grains, low-fat dairy products, and lean protein. Focusing on smart food choices from each of the 5 food groups can help you stay on track at restaurants.  Choose items that are boiled, broiled, grilled, or steamed.  Choose water, milk, unsweetened iced tea, or other drinks without added sugars. If you want an alcoholic beverage, choose a lower calorie option. For example, a regular margarita can have up to 700 calories and a glass of wine has around 150.  Stay away from items that are buttered, battered, fried, or served with  cream sauce. Items labeled "crispy" are usually fried, unless stated otherwise.  Ask for dressings, sauces, and syrups on the side. These are usually very high in calories, so do not eat much of them.  Watch out for salads. Many people think salads are a healthy option, but this is often not the case. Many salads come with bacon, fried chicken, lots of cheese, fried chips, and dressing. All of these items have a  lot of calories. If you want a salad, choose a garden salad and ask for grilled meats or steak. Ask for the dressing on the side, or ask for olive oil and vinegar or lemon to use as dressing.  Estimate how many servings of a food you are given. For example, a serving of cooked rice is  cup or about the size of half a tennis ball or one cupcake wrapper. Knowing serving sizes will help you be aware of how much food you are eating at restaurants. The list below tells you how big or small some common portion sizes are based on everyday objects.  1 oz--4 stacked dice.  3 oz--1 deck of cards.  1 tsp--1 dice.  1 Tbsp-- a Ping-Pong ball.  2 Tbsp--1 Ping-Pong ball.   cup--1 tennis ball or 1 cupcake wrapper.  1 cup--1 baseball. Document Released: 04/25/2005 Document Revised: 09/09/2013 Document Reviewed: 02/28/2013 [facility] Patient Information 2015 [place], [place]. This information is not intended to replace advice given to you by your health care provider. Make sure you discuss any questions you have with your health care provider.

## 2014-05-20 NOTE — Progress Notes (Signed)
  Subjective:     Natalie Gordon is a 44 y.o. female and is here for a comprehensive physical exam. The patient reports problems - overwieght, last year used 3 months of phentermine.  Has kept weight off, and desires refill again.Marland Kitchen No cycle since ablation. History   Social History  . Marital Status: Married    Spouse Name: N/A    Number of Children: N/A  . Years of Education: N/A   Occupational History  . Not on file.   Social History Main Topics  . Smoking status: Former Research scientist (life sciences)  . Smokeless tobacco: Not on file     Comment: quit x 47yrs ago  . Alcohol Use: Yes     Comment: social  . Drug Use: No  . Sexual Activity:    Partners: Male    Birth Control/ Protection: Surgical     Comment: tubaligation.   Other Topics Concern  . Not on file   Social History Narrative   Health Maintenance  Topic Date Due  . Samul Dada  05/19/1990  . INFLUENZA VACCINE  12/07/2013  . PAP SMEAR  05/14/2016    The following portions of the patient's history were reviewed and updated as appropriate: allergies, current medications, past family history, past medical history, past social history, past surgical history and problem list.  Review of Systems Pertinent items are noted in HPI.   Objective:    BP 142/93 mmHg  Pulse 83  Ht 5\' 4"  (1.626 m)  Wt 215 lb (97.523 kg)  BMI 36.89 kg/m2 General appearance: alert, cooperative and appears stated age Head: Normocephalic, without obvious abnormality, atraumatic Neck: no adenopathy, supple, symmetrical, trachea midline and thyroid not enlarged, symmetric, no tenderness/mass/nodules Lungs: clear to auscultation bilaterally Breasts: normal appearance, no masses or tenderness Heart: regular rate and rhythm, S1, S2 normal, no murmur, click, rub or gallop Abdomen: soft, non-tender; bowel sounds normal; no masses,  no organomegaly Pelvic: cervix normal in appearance, external genitalia normal, no adnexal masses or tenderness, no cervical motion  tenderness, uterus normal size, shape, and consistency and vagina normal without discharge Extremities: extremities normal, atraumatic, no cyanosis or edema Pulses: 2+ and symmetric Skin: Skin color, texture, turgor normal. No rashes or lesions Lymph nodes: Cervical, supraclavicular, and axillary nodes normal. Neurologic: Grossly normal    Assessment:    Healthy female exam.      Plan:   Problem List Items Addressed This Visit      Unprioritized   Obesity - Primary   Relevant Medications      phentermine capsule    Other Visit Diagnoses    Encounter for routine gynecological examination         Pap nml with neg HPV in 2015.  Other health maintenance done through her work, including mammography.   See After Visit Summary for Counseling Recommendations

## 2014-08-26 ENCOUNTER — Encounter: Payer: Self-pay | Admitting: Family Medicine

## 2014-08-26 ENCOUNTER — Ambulatory Visit: Admit: 2014-08-26 | Disposition: A | Discharge status: Home / Self Care | Payer: Self-pay | Admitting: Family Medicine

## 2015-06-01 ENCOUNTER — Ambulatory Visit (INDEPENDENT_AMBULATORY_CARE_PROVIDER_SITE_OTHER): Payer: BLUE CROSS/BLUE SHIELD | Admitting: Family Medicine

## 2015-06-01 ENCOUNTER — Encounter: Payer: Self-pay | Admitting: Family Medicine

## 2015-06-01 VITALS — BP 140/85 | HR 80 | Ht 64.0 in | Wt 200.0 lb

## 2015-06-01 DIAGNOSIS — N898 Other specified noninflammatory disorders of vagina: Secondary | ICD-10-CM | POA: Diagnosis not present

## 2015-06-01 DIAGNOSIS — E669 Obesity, unspecified: Secondary | ICD-10-CM

## 2015-06-01 DIAGNOSIS — Z1151 Encounter for screening for human papillomavirus (HPV): Secondary | ICD-10-CM

## 2015-06-01 DIAGNOSIS — Z01419 Encounter for gynecological examination (general) (routine) without abnormal findings: Secondary | ICD-10-CM

## 2015-06-01 DIAGNOSIS — Z124 Encounter for screening for malignant neoplasm of cervix: Secondary | ICD-10-CM | POA: Diagnosis not present

## 2015-06-01 MED ORDER — PHENTERMINE HCL 37.5 MG PO CAPS: 37.5000 mg | ORAL_CAPSULE | Freq: Every morning | ORAL | Status: DC

## 2015-06-01 NOTE — Patient Instructions (Signed)
DASH Eating Plan DASH stands for "Dietary Approaches to Stop Hypertension." The DASH eating plan is a healthy eating plan that has been shown to reduce high blood pressure (hypertension). Additional health benefits may include reducing the risk of type 2 diabetes mellitus, heart disease, and stroke. The DASH eating plan may also help with weight loss. WHAT DO I NEED TO KNOW ABOUT THE DASH EATING PLAN? For the DASH eating plan, you will follow these general guidelines:  Choose foods with a percent daily value for sodium of less than 5% (as listed on the food label).  Use salt-free seasonings or herbs instead of table salt or sea salt.  Check with your health care provider or pharmacist before using salt substitutes.  Eat lower-sodium products, often labeled as "lower sodium" or "no salt added."  Eat fresh foods.  Eat more vegetables, fruits, and low-fat dairy products.  Choose whole grains. Look for the word "whole" as the first word in the ingredient list.  Choose fish and skinless chicken or [place] more often than red meat. Limit fish, poultry, and meat to 6 oz (170 g) each day.  Limit sweets, desserts, sugars, and sugary drinks.  Choose heart-healthy fats.  Limit cheese to 1 oz (28 g) per day.  Eat more home-cooked food and less restaurant, buffet, and fast food.  Limit fried foods.  Cook foods using methods other than frying.  Limit canned vegetables. If you do use them, rinse them well to decrease the sodium.  When eating at a restaurant, ask that your food be prepared with less salt, or no salt if possible. WHAT FOODS CAN I EAT? Seek help from a dietitian for individual calorie needs. Grains Whole grain or whole wheat bread. Brown rice. Whole grain or whole wheat pasta. Quinoa, bulgur, and whole grain cereals. Low-sodium cereals. Corn or whole wheat flour tortillas. Whole grain cornbread. Whole grain crackers. Low-sodium crackers. Vegetables Fresh or frozen vegetables  (raw, steamed, roasted, or grilled). Low-sodium or reduced-sodium tomato and vegetable juices. Low-sodium or reduced-sodium tomato sauce and paste. Low-sodium or reduced-sodium canned vegetables.  Fruits All fresh, canned (in natural juice), or frozen fruits. Meat and Other Protein Products Ground beef (85% or leaner), grass-fed beef, or beef trimmed of fat. Skinless chicken or [place]. Ground chicken or [place]. Pork trimmed of fat. All fish and seafood. Eggs. Dried beans, peas, or lentils. Unsalted nuts and seeds. Unsalted canned beans. Dairy Low-fat dairy products, such as skim or 1% milk, 2% or reduced-fat cheeses, low-fat ricotta or cottage cheese, or plain low-fat yogurt. Low-sodium or reduced-sodium cheeses. Fats and Oils Tub margarines without trans fats. Light or reduced-fat mayonnaise and salad dressings (reduced sodium). Avocado. Safflower, olive, or canola oils. Natural peanut or almond butter. Other Unsalted popcorn and pretzels. The items listed above may not be a complete list of recommended foods or beverages. Contact your dietitian for more options. WHAT FOODS ARE NOT RECOMMENDED? Grains White bread. White pasta. White rice. Refined cornbread. Bagels and croissants. Crackers that contain trans fat. Vegetables Creamed or fried vegetables. Vegetables in a cheese sauce. Regular canned vegetables. Regular canned tomato sauce and paste. Regular tomato and vegetable juices. Fruits Dried fruits. Canned fruit in light or heavy syrup. Fruit juice. Meat and Other Protein Products Fatty cuts of meat. Ribs, chicken wings, bacon, sausage, bologna, salami, chitterlings, fatback, hot dogs, bratwurst, and packaged luncheon meats. Salted nuts and seeds. Canned beans with salt. Dairy Whole or 2% milk, cream, half-and-half, and cream cheese. Whole-fat or sweetened yogurt. Full-fat  cheeses or blue cheese. Nondairy creamers and whipped toppings. Processed cheese, cheese spreads, or cheese  curds. Condiments Onion and garlic salt, seasoned salt, table salt, and sea salt. Canned and packaged gravies. Worcestershire sauce. Tartar sauce. Barbecue sauce. Teriyaki sauce. Soy sauce, including reduced sodium. Steak sauce. Fish sauce. Oyster sauce. Cocktail sauce. Horseradish. Ketchup and mustard. Meat flavorings and tenderizers. Bouillon cubes. Hot sauce. Tabasco sauce. Marinades. Taco seasonings. Relishes. Fats and Oils Butter, stick margarine, lard, shortening, ghee, and bacon fat. Coconut, palm kernel, or palm oils. Regular salad dressings. Other Pickles and olives. Salted popcorn and pretzels. The items listed above may not be a complete list of foods and beverages to avoid. Contact your dietitian for more information. WHERE CAN I FIND MORE INFORMATION? National Heart, Lung, and [employer] Institute: travelstabloid.com   This information is not intended to replace advice given to you by your health care provider. Make sure you discuss any questions you have with your health care provider.   Document Released: 04/14/2011 Document Revised: 05/16/2014 Document Reviewed: 02/27/2013 Elsevier Interactive Patient Education 2016 [place] for Adults, Female A healthy lifestyle and preventive care can promote health and wellness. Preventive health guidelines for women include the following key practices.  A routine yearly physical is a good way to check with your health care provider about your health and preventive screening. It is a chance to share any concerns and updates on your health and to receive a thorough exam.  Visit your dentist for a routine exam and preventive care every 6 months. Brush your teeth twice a day and floss once a day. Good oral hygiene prevents tooth decay and gum disease.  The frequency of eye exams is based on your age, health, family medical history, use of contact lenses, and other factors. Follow your health care  provider's recommendations for frequency of eye exams.  Eat a healthy diet. Foods like vegetables, fruits, whole grains, low-fat dairy products, and lean protein foods contain the nutrients you need without too many calories. Decrease your intake of foods high in solid fats, added sugars, and salt. Eat the right amount of calories for you.Get information about a proper diet from your health care provider, if necessary.  Regular physical exercise is one of the most important things you can do for your health. Most adults should get at least 150 minutes of moderate-intensity exercise (any activity that increases your heart rate and causes you to sweat) each week. In addition, most adults need muscle-strengthening exercises on 2 or more days a week.  Maintain a healthy weight. The body mass index (BMI) is a screening tool to identify possible weight problems. It provides an estimate of body fat based on height and weight. Your health care provider can find your BMI and can help you achieve or maintain a healthy weight.For adults 20 years and older:  A BMI below 18.5 is considered underweight.  A BMI of 18.5 to 24.9 is normal.  A BMI of 25 to 29.9 is considered overweight.  A BMI of 30 and above is considered obese.  Maintain normal blood lipids and cholesterol levels by exercising and minimizing your intake of saturated fat. Eat a balanced diet with plenty of fruit and vegetables. Blood tests for lipids and cholesterol should begin at age 73 and be repeated every 5 years. If your lipid or cholesterol levels are high, you are over 50, or you are at high risk for heart disease, you may need your cholesterol levels checked more  frequently.Ongoing high lipid and cholesterol levels should be treated with medicines if diet and exercise are not working.  If you smoke, find out from your health care provider how to quit. If you do not use tobacco, do not start.  Lung cancer screening is recommended for  adults aged 60-80 years who are at high risk for developing lung cancer because of a history of smoking. A yearly low-dose CT scan of the lungs is recommended for people who have at least a 30-pack-year history of smoking and are a current smoker or have quit within the past 15 years. A pack year of smoking is smoking an average of 1 pack of cigarettes a day for 1 year (for example: 1 pack a day for 30 years or 2 packs a day for 15 years). Yearly screening should continue until the smoker has stopped smoking for at least 15 years. Yearly screening should be stopped for people who develop a health problem that would prevent them from having lung cancer treatment.  If you are pregnant, do not drink alcohol. If you are breastfeeding, be very cautious about drinking alcohol. If you are not pregnant and choose to drink alcohol, do not have more than 1 drink per day. One drink is considered to be 12 ounces (355 mL) of beer, 5 ounces (148 mL) of wine, or 1.5 ounces (44 mL) of liquor.  Avoid use of street drugs. Do not share needles with anyone. Ask for help if you need support or instructions about stopping the use of drugs.  High blood pressure causes heart disease and increases the risk of stroke. Your blood pressure should be checked at least every 1 to 2 years. Ongoing high blood pressure should be treated with medicines if weight loss and exercise do not work.  If you are 22-57 years old, ask your health care provider if you should take aspirin to prevent strokes.  Diabetes screening is done by taking a blood sample to check your blood glucose level after you have not eaten for a certain period of time (fasting). If you are not overweight and you do not have risk factors for diabetes, you should be screened once every 3 years starting at age 56. If you are overweight or obese and you are 82-74 years of age, you should be screened for diabetes every year as part of your cardiovascular risk  assessment.  Breast cancer screening is essential preventive care for women. You should practice "breast self-awareness." This means understanding the normal appearance and feel of your breasts and may include breast self-examination. Any changes detected, no matter how small, should be reported to a health care provider. Women in their 54s and 30s should have a clinical breast exam (CBE) by a health care provider as part of a regular health exam every 1 to 3 years. After age 8, women should have a CBE every year. Starting at age 32, women should consider having a mammogram (breast X-ray test) every year. Women who have a family history of breast cancer should talk to their health care provider about genetic screening. Women at a high risk of breast cancer should talk to their health care providers about having an MRI and a mammogram every year.  Breast cancer gene (BRCA)-related cancer risk assessment is recommended for women who have family members with BRCA-related cancers. BRCA-related cancers include breast, ovarian, tubal, and peritoneal cancers. Having family members with these cancers may be associated with an increased risk for harmful changes (mutations) in  the breast cancer genes BRCA1 and BRCA2. Results of the assessment will determine the need for genetic counseling and BRCA1 and BRCA2 testing.  Your health care provider may recommend that you be screened regularly for cancer of the pelvic organs (ovaries, uterus, and vagina). This screening involves a pelvic examination, including checking for microscopic changes to the surface of your cervix (Pap test). You may be encouraged to have this screening done every 3 years, beginning at age 92.  For women ages 38-65, health care providers may recommend pelvic exams and Pap testing every 3 years, or they may recommend the Pap and pelvic exam, combined with testing for human papilloma virus (HPV), every 5 years. Some types of HPV increase your risk of  cervical cancer. Testing for HPV may also be done on women of any age with unclear Pap test results.  Other health care providers may not recommend any screening for nonpregnant women who are considered low risk for pelvic cancer and who do not have symptoms. Ask your health care provider if a screening pelvic exam is right for you.  If you have had past treatment for cervical cancer or a condition that could lead to cancer, you need Pap tests and screening for cancer for at least 20 years after your treatment. If Pap tests have been discontinued, your risk factors (such as having a new sexual partner) need to be reassessed to determine if screening should resume. Some women have medical problems that increase the chance of getting cervical cancer. In these cases, your health care provider may recommend more frequent screening and Pap tests.  Colorectal cancer can be detected and often prevented. Most routine colorectal cancer screening begins at the age of 31 years and continues through age 31 years. However, your health care provider may recommend screening at an earlier age if you have risk factors for colon cancer. On a yearly basis, your health care provider may provide home test kits to check for hidden blood in the stool. Use of a small camera at the end of a tube, to directly examine the colon (sigmoidoscopy or colonoscopy), can detect the earliest forms of colorectal cancer. Talk to your health care provider about this at age 40, when routine screening begins. Direct exam of the colon should be repeated every 5-10 years through age 20 years, unless early forms of precancerous polyps or small growths are found.  People who are at an increased risk for hepatitis B should be screened for this virus. You are considered at high risk for hepatitis B if:  You were born in a country where hepatitis B occurs often. Talk with your health care provider about which countries are considered high risk.  Your  parents were born in a high-risk country and you have not received a shot to protect against hepatitis B (hepatitis B vaccine).  You have HIV or AIDS.  You use needles to inject street drugs.  You live with, or have sex with, someone who has hepatitis B.  You get hemodialysis treatment.  You take certain medicines for conditions like cancer, organ transplantation, and autoimmune conditions.  Hepatitis C blood testing is recommended for all people born from [birth year] through 1964 and any individual with known risks for hepatitis C.  Practice safe sex. Use condoms and avoid high-risk sexual practices to reduce the spread of sexually transmitted infections (STIs). STIs include gonorrhea, chlamydia, syphilis, trichomonas, herpes, HPV, and human immunodeficiency virus (HIV). Herpes, HIV, and HPV are viral illnesses that have  no cure. They can result in disability, cancer, and death.  You should be screened for sexually transmitted illnesses (STIs) including gonorrhea and chlamydia if:  You are sexually active and are younger than 24 years.  You are older than 24 years and your health care provider tells you that you are at risk for this type of infection.  Your sexual activity has changed since you were last screened and you are at an increased risk for chlamydia or gonorrhea. Ask your health care provider if you are at risk.  If you are at risk of being infected with HIV, it is recommended that you take a prescription medicine daily to prevent HIV infection. This is called preexposure prophylaxis (PrEP). You are considered at risk if:  You are sexually active and do not regularly use condoms or know the HIV status of your partner(s).  You take drugs by injection.  You are sexually active with a partner who has HIV.  Talk with your health care provider about whether you are at high risk of being infected with HIV. If you choose to begin PrEP, you should first be tested for HIV. You should then  be tested every 3 months for as long as you are taking PrEP.  Osteoporosis is a disease in which the bones lose minerals and strength with aging. This can result in serious bone fractures or breaks. The risk of osteoporosis can be identified using a bone density scan. Women ages 16 years and over and women at risk for fractures or osteoporosis should discuss screening with their health care providers. Ask your health care provider whether you should take a calcium supplement or vitamin D to reduce the rate of osteoporosis.  Menopause can be associated with physical symptoms and risks. Hormone replacement therapy is available to decrease symptoms and risks. You should talk to your health care provider about whether hormone replacement therapy is right for you.  Use sunscreen. Apply sunscreen liberally and repeatedly throughout the day. You should seek shade when your shadow is shorter than you. Protect yourself by wearing long sleeves, pants, a wide-brimmed hat, and sunglasses year round, whenever you are outdoors.  Once a month, do a whole body skin exam, using a mirror to look at the skin on your back. Tell your health care provider of new moles, moles that have irregular borders, moles that are larger than a pencil eraser, or moles that have changed in shape or color.  Stay current with required vaccines (immunizations).  Influenza vaccine. All adults should be immunized every year.  Tetanus, diphtheria, and acellular pertussis (Td, Tdap) vaccine. Pregnant women should receive 1 dose of Tdap vaccine during each pregnancy. The dose should be obtained regardless of the length of time since the last dose. Immunization is preferred during the 27th-36th week of gestation. An adult who has not previously received Tdap or who does not know her vaccine status should receive 1 dose of Tdap. This initial dose should be followed by tetanus and diphtheria toxoids (Td) booster doses every 10 years. Adults with an  unknown or incomplete history of completing a 3-dose immunization series with Td-containing vaccines should begin or complete a primary immunization series including a Tdap dose. Adults should receive a Td booster every 10 years.  Varicella vaccine. An adult without evidence of immunity to varicella should receive 2 doses or a second dose if she has previously received 1 dose. Pregnant females who do not have evidence of immunity should receive the first dose  after pregnancy. This first dose should be obtained before leaving the health care facility. The second dose should be obtained 4-8 weeks after the first dose.  Human papillomavirus (HPV) vaccine. Females aged 13-26 years who have not received the vaccine previously should obtain the 3-dose series. The vaccine is not recommended for use in pregnant females. However, pregnancy testing is not needed before receiving a dose. If a female is found to be pregnant after receiving a dose, no treatment is needed. In that case, the remaining doses should be delayed until after the pregnancy. Immunization is recommended for any person with an immunocompromised condition through the age of 1 years if she did not get any or all doses earlier. During the 3-dose series, the second dose should be obtained 4-8 weeks after the first dose. The third dose should be obtained 24 weeks after the first dose and 16 weeks after the second dose.  Zoster vaccine. One dose is recommended for adults aged 57 years or older unless certain conditions are present.  Measles, mumps, and rubella (MMR) vaccine. Adults born before [birth year] generally are considered immune to measles and mumps. Adults born in [birth year] or later should have 1 or more doses of MMR vaccine unless there is a contraindication to the vaccine or there is laboratory evidence of immunity to each of the three diseases. A routine second dose of MMR vaccine should be obtained at least 28 days after the first dose for students  attending postsecondary schools, health care workers, or international travelers. People who received inactivated measles vaccine or an unknown type of measles vaccine during 1963-1967 should receive 2 doses of MMR vaccine. People who received inactivated mumps vaccine or an unknown type of mumps vaccine before 1979 and are at high risk for mumps infection should consider immunization with 2 doses of MMR vaccine. For females of childbearing age, rubella immunity should be determined. If there is no evidence of immunity, females who are not pregnant should be vaccinated. If there is no evidence of immunity, females who are pregnant should delay immunization until after pregnancy. Unvaccinated health care workers born before [birth year] who lack laboratory evidence of measles, mumps, or rubella immunity or laboratory confirmation of disease should consider measles and mumps immunization with 2 doses of MMR vaccine or rubella immunization with 1 dose of MMR vaccine.  Pneumococcal 13-valent conjugate (PCV13) vaccine. When indicated, a person who is uncertain of his immunization history and has no record of immunization should receive the PCV13 vaccine. All adults 27 years of age and older should receive this vaccine. An adult aged 23 years or older who has certain medical conditions and has not been previously immunized should receive 1 dose of PCV13 vaccine. This PCV13 should be followed with a dose of pneumococcal polysaccharide (PPSV23) vaccine. Adults who are at high risk for pneumococcal disease should obtain the PPSV23 vaccine at least 8 weeks after the dose of PCV13 vaccine. Adults older than 44 years of age who have normal immune system function should obtain the PPSV23 vaccine dose at least 1 year after the dose of PCV13 vaccine.  Pneumococcal polysaccharide (PPSV23) vaccine. When PCV13 is also indicated, PCV13 should be obtained first. All adults aged 90 years and older should be immunized. An adult younger than  age 28 years who has certain medical conditions should be immunized. Any person who resides in a nursing home or long-term care facility should be immunized. An adult smoker should be immunized. People with an immunocompromised condition and certain  other conditions should receive both PCV13 and PPSV23 vaccines. People with human immunodeficiency virus (HIV) infection should be immunized as soon as possible after diagnosis. Immunization during chemotherapy or radiation therapy should be avoided. Routine use of PPSV23 vaccine is not recommended for American Indians, [place]atives, or people younger than 65 years unless there are medical conditions that require PPSV23 vaccine. When indicated, people who have unknown immunization and have no record of immunization should receive PPSV23 vaccine. One-time revaccination 5 years after the first dose of PPSV23 is recommended for people aged 19-64 years who have chronic kidney failure, nephrotic syndrome, asplenia, or immunocompromised conditions. People who received 1-2 doses of PPSV23 before age 84 years should receive another dose of PPSV23 vaccine at age 69 years or later if at least 5 years have passed since the previous dose. Doses of PPSV23 are not needed for people immunized with PPSV23 at or after age 74 years.  Meningococcal vaccine. Adults with asplenia or persistent complement component deficiencies should receive 2 doses of quadrivalent meningococcal conjugate (MenACWY-D) vaccine. The doses should be obtained at least 2 months apart. Microbiologists working with certain meningococcal bacteria, [employer] [place] recruits, people at risk during an outbreak, and people who travel to or live in countries with a high rate of meningitis should be immunized. A first-year college student up through age 51 years who is living in a residence hall should receive a dose if she did not receive a dose on or after her 16th birthday. Adults who have certain high-risk conditions  should receive one or more doses of vaccine.  Hepatitis A vaccine. Adults who wish to be protected from this disease, have certain high-risk conditions, work with hepatitis A-infected animals, work in hepatitis A research labs, or travel to or work in countries with a high rate of hepatitis A should be immunized. Adults who were previously unvaccinated and who anticipate close contact with an international adoptee during the first 60 days after arrival in the [place] from a country with a high rate of hepatitis A should be immunized.  Hepatitis B vaccine. Adults who wish to be protected from this disease, have certain high-risk conditions, may be exposed to blood or other infectious body fluids, are household contacts or sex partners of hepatitis B positive people, are clients or workers in certain care facilities, or travel to or work in countries with a high rate of hepatitis B should be immunized.  Haemophilus influenzae type b (Hib) vaccine. A previously unvaccinated person with asplenia or sickle cell disease or having a scheduled splenectomy should receive 1 dose of Hib vaccine. Regardless of previous immunization, a recipient of a hematopoietic stem cell transplant should receive a 3-dose series 6-12 months after her successful transplant. Hib vaccine is not recommended for adults with HIV infection. Preventive Services / Frequency Ages 92 to 25 years  Blood pressure check.** / Every 3-5 years.  Lipid and cholesterol check.** / Every 5 years beginning at age 56.  Clinical breast exam.** / Every 3 years for women in their 58s and 39s.  BRCA-related cancer risk assessment.** / For women who have family members with a BRCA-related cancer (breast, ovarian, tubal, or peritoneal cancers).  Pap test.** / Every 2 years from ages 76 through 83. Every 3 years starting at age 44 through age 41 or 72 with a history of 3 consecutive normal Pap tests.  HPV screening.** / Every 3 years from ages 32  through ages 74 to 24 with a history of 3 consecutive  normal Pap tests.  Hepatitis C blood test.** / For any individual with known risks for hepatitis C.  Skin self-exam. / Monthly.  Influenza vaccine. / Every year.  Tetanus, diphtheria, and acellular pertussis (Tdap, Td) vaccine.** / Consult your health care provider. Pregnant women should receive 1 dose of Tdap vaccine during each pregnancy. 1 dose of Td every 10 years.  Varicella vaccine.** / Consult your health care provider. Pregnant females who do not have evidence of immunity should receive the first dose after pregnancy.  HPV vaccine. / 3 doses over 6 months, if 67 and younger. The vaccine is not recommended for use in pregnant females. However, pregnancy testing is not needed before receiving a dose.  Measles, mumps, rubella (MMR) vaccine.** / You need at least 1 dose of MMR if you were born in 1957 or later. You may also need a 2nd dose. For females of childbearing age, rubella immunity should be determined. If there is no evidence of immunity, females who are not pregnant should be vaccinated. If there is no evidence of immunity, females who are pregnant should delay immunization until after pregnancy.  Pneumococcal 13-valent conjugate (PCV13) vaccine.** / Consult your health care provider.  Pneumococcal polysaccharide (PPSV23) vaccine.** / 1 to 2 doses if you smoke cigarettes or if you have certain conditions.  Meningococcal vaccine.** / 1 dose if you are age 61 to 31 years and a Market researcher living in a residence hall, or have one of several medical conditions, you need to get vaccinated against meningococcal disease. You may also need additional booster doses.  Hepatitis A vaccine.** / Consult your health care provider.  Hepatitis B vaccine.** / Consult your health care provider.  Haemophilus influenzae type b (Hib) vaccine.** / Consult your health care provider. Ages 67 to 72 years  Blood pressure check.** /  Every year.  Lipid and cholesterol check.** / Every 5 years beginning at age 58 years.  Lung cancer screening. / Every year if you are aged 66-80 years and have a 30-pack-year history of smoking and currently smoke or have quit within the past 15 years. Yearly screening is stopped once you have quit smoking for at least 15 years or develop a health problem that would prevent you from having lung cancer treatment.  Clinical breast exam.** / Every year after age 83 years.  BRCA-related cancer risk assessment.** / For women who have family members with a BRCA-related cancer (breast, ovarian, tubal, or peritoneal cancers).  Mammogram.** / Every year beginning at age 51 years and continuing for as long as you are in good health. Consult with your health care provider.  Pap test.** / Every 3 years starting at age 74 years through age 83 or 57 years with a history of 3 consecutive normal Pap tests.  HPV screening.** / Every 3 years from ages 1 years through ages 83 to 14 years with a history of 3 consecutive normal Pap tests.  Fecal occult blood test (FOBT) of stool. / Every year beginning at age 57 years and continuing until age 25 years. You may not need to do this test if you get a colonoscopy every 10 years.  Flexible sigmoidoscopy or colonoscopy.** / Every 5 years for a flexible sigmoidoscopy or every 10 years for a colonoscopy beginning at age 45 years and continuing until age 67 years.  Hepatitis C blood test.** / For all people born from [birth year] through 1965 and any individual with known risks for hepatitis C.  Skin self-exam. / Monthly.  Influenza vaccine. / Every year.  Tetanus, diphtheria, and acellular pertussis (Tdap/Td) vaccine.** / Consult your health care provider. Pregnant women should receive 1 dose of Tdap vaccine during each pregnancy. 1 dose of Td every 10 years.  Varicella vaccine.** / Consult your health care provider. Pregnant females who do not have evidence of immunity  should receive the first dose after pregnancy.  Zoster vaccine.** / 1 dose for adults aged 14 years or older.  Measles, mumps, rubella (MMR) vaccine.** / You need at least 1 dose of MMR if you were born in 1957 or later. You may also need a second dose. For females of childbearing age, rubella immunity should be determined. If there is no evidence of immunity, females who are not pregnant should be vaccinated. If there is no evidence of immunity, females who are pregnant should delay immunization until after pregnancy.  Pneumococcal 13-valent conjugate (PCV13) vaccine.** / Consult your health care provider.  Pneumococcal polysaccharide (PPSV23) vaccine.** / 1 to 2 doses if you smoke cigarettes or if you have certain conditions.  Meningococcal vaccine.** / Consult your health care provider.  Hepatitis A vaccine.** / Consult your health care provider.  Hepatitis B vaccine.** / Consult your health care provider.  Haemophilus influenzae type b (Hib) vaccine.** / Consult your health care provider. Ages 31 years and over  Blood pressure check.** / Every year.  Lipid and cholesterol check.** / Every 5 years beginning at age 6 years.  Lung cancer screening. / Every year if you are aged 69-80 years and have a 30-pack-year history of smoking and currently smoke or have quit within the past 15 years. Yearly screening is stopped once you have quit smoking for at least 15 years or develop a health problem that would prevent you from having lung cancer treatment.  Clinical breast exam.** / Every year after age 1 years.  BRCA-related cancer risk assessment.** / For women who have family members with a BRCA-related cancer (breast, ovarian, tubal, or peritoneal cancers).  Mammogram.** / Every year beginning at age 41 years and continuing for as long as you are in good health. Consult with your health care provider.  Pap test.** / Every 3 years starting at age 21 years through age 37 or 36 years with  3 consecutive normal Pap tests. Testing can be stopped between 65 and 70 years with 3 consecutive normal Pap tests and no abnormal Pap or HPV tests in the past 10 years.  HPV screening.** / Every 3 years from ages 58 years through ages 60 or 24 years with a history of 3 consecutive normal Pap tests. Testing can be stopped between 65 and 70 years with 3 consecutive normal Pap tests and no abnormal Pap or HPV tests in the past 10 years.  Fecal occult blood test (FOBT) of stool. / Every year beginning at age 55 years and continuing until age 50 years. You may not need to do this test if you get a colonoscopy every 10 years.  Flexible sigmoidoscopy or colonoscopy.** / Every 5 years for a flexible sigmoidoscopy or every 10 years for a colonoscopy beginning at age 45 years and continuing until age 55 years.  Hepatitis C blood test.** / For all people born from [birth year] through 1965 and any individual with known risks for hepatitis C.  Osteoporosis screening.** / A one-time screening for women ages 65 years and over and women at risk for fractures or osteoporosis.  Skin self-exam. / Monthly.  Influenza vaccine. / Every year.  Tetanus,  diphtheria, and acellular pertussis (Tdap/Td) vaccine.** / 1 dose of Td every 10 years.  Varicella vaccine.** / Consult your health care provider.  Zoster vaccine.** / 1 dose for adults aged 64 years or older.  Pneumococcal 13-valent conjugate (PCV13) vaccine.** / Consult your health care provider.  Pneumococcal polysaccharide (PPSV23) vaccine.** / 1 dose for all adults aged 42 years and older.  Meningococcal vaccine.** / Consult your health care provider.  Hepatitis A vaccine.** / Consult your health care provider.  Hepatitis B vaccine.** / Consult your health care provider.  Haemophilus influenzae type b (Hib) vaccine.** / Consult your health care provider. ** Family history and personal history of risk and conditions may change your health care provider's  recommendations.   This information is not intended to replace advice given to you by your health care provider. Make sure you discuss any questions you have with your health care provider.   Document Released: 06/21/2001 Document Revised: 05/16/2014 Document Reviewed: 09/20/2010 Elsevier Interactive Patient Education [employer].

## 2015-06-01 NOTE — Progress Notes (Signed)
  Subjective:     Natalie Gordon is a 45 y.o. female and is here for a comprehensive physical exam. The patient reports problems - has been going through a difficult time. Lost her grandmother to dementia.  Has white coat HTN. Has had blood work and flu and mammogram through her work. Reports vaginal dryness.  Social History   Social History  . Marital Status: Married    Spouse Name: N/A  . Number of Children: N/A  . Years of Education: N/A   Occupational History  . Not on file.   Social History Main Topics  . Smoking status: Former Research scientist (life sciences)  . Smokeless tobacco: Not on file     Comment: quit x 9yrs ago  . Alcohol Use: Yes     Comment: social  . Drug Use: No  . Sexual Activity:    Partners: Male    Birth Control/ Protection: Surgical     Comment: tubaligation.   Other Topics Concern  . Not on file   Social History Narrative   Health Maintenance  Topic Date Due  . HIV Screening  05/19/1986  . TETANUS/TDAP  05/19/1990  . INFLUENZA VACCINE  12/08/2014  . PAP SMEAR  05/14/2016    The following portions of the patient's history were reviewed and updated as appropriate: allergies, current medications, past family history, past medical history, past social history, past surgical history and problem list.  Review of Systems Pertinent items noted in HPI and remainder of comprehensive ROS otherwise negative.   Objective:    BP 140/85 mmHg  Pulse 80  Ht 5\' 4"  (1.626 m)  Wt 200 lb (90.719 kg)  BMI 34.31 kg/m2  LMP 05/01/2015 General appearance: alert, cooperative and appears stated age Head: Normocephalic, without obvious abnormality, atraumatic Neck: no adenopathy, supple, symmetrical, trachea midline and thyroid not enlarged, symmetric, no tenderness/mass/nodules Lungs: clear to auscultation bilaterally Breasts: normal appearance, no masses or tenderness Heart: regular rate and rhythm, S1, S2 normal, no murmur, click, rub or gallop Abdomen: soft, non-tender; bowel  sounds normal; no masses,  no organomegaly Pelvic: cervix normal in appearance, external genitalia normal, no adnexal masses or tenderness, no cervical motion tenderness, vagina normal without discharge and uterus is 8-10 wk size and firm Extremities: extremities normal, atraumatic, no cyanosis or edema Pulses: 2+ and symmetric Skin: Skin color, texture, turgor normal. No rashes or lesions Lymph nodes: Cervical, supraclavicular, and axillary nodes normal. Neurologic: Grossly normal    Assessment:    Healthy female exam.      Plan:   Problem List Items Addressed This Visit      Unprioritized   Obesity   Relevant Medications   phentermine 37.5 MG capsule    Other Visit Diagnoses    Encounter for routine gynecological examination    -  Primary    Screening for malignant neoplasm of cervix        Relevant Orders    Cytology - PAP    Vaginal dryness        liberal use of water based lubricant.         See After Visit Summary for Counseling Recommendations

## 2015-06-04 LAB — CYTOLOGY - PAP

## 2016-03-24 DIAGNOSIS — I10 Essential (primary) hypertension: Secondary | ICD-10-CM | POA: Insufficient documentation

## 2016-06-07 ENCOUNTER — Encounter: Payer: Self-pay | Admitting: Family Medicine

## 2016-06-07 ENCOUNTER — Ambulatory Visit (INDEPENDENT_AMBULATORY_CARE_PROVIDER_SITE_OTHER): Payer: BLUE CROSS/BLUE SHIELD | Admitting: Family Medicine

## 2016-06-07 VITALS — BP 147/88 | HR 82 | Ht 64.0 in | Wt 202.0 lb

## 2016-06-07 DIAGNOSIS — E6609 Other obesity due to excess calories: Secondary | ICD-10-CM

## 2016-06-07 DIAGNOSIS — Z0000 Encounter for general adult medical examination without abnormal findings: Secondary | ICD-10-CM

## 2016-06-07 DIAGNOSIS — Z01411 Encounter for gynecological examination (general) (routine) with abnormal findings: Secondary | ICD-10-CM

## 2016-06-07 DIAGNOSIS — Z6834 Body mass index (BMI) 34.0-34.9, adult: Principal | ICD-10-CM

## 2016-06-07 MED ORDER — PHENTERMINE HCL 37.5 MG PO CAPS: 37.5000 mg | ORAL_CAPSULE | Freq: Every morning | ORAL | 2 refills | Status: DC

## 2016-06-07 NOTE — Progress Notes (Signed)
Pt recently separated and noticed increased appetite. She would like a refill on phentermine.  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

## 2016-06-07 NOTE — Patient Instructions (Signed)
 Preventive Care 18-39 Years, Female Preventive care refers to lifestyle choices and visits with your health care provider that can promote health and wellness. What does preventive care include?  A yearly physical exam. This is also called an annual well check.  Dental exams once or twice a year.  Routine eye exams. Ask your health care provider how often you should have your eyes checked.  Personal lifestyle choices, including:  Daily care of your teeth and gums.  Regular physical activity.  Eating a healthy diet.  Avoiding tobacco and drug use.  Limiting alcohol use.  Practicing safe sex.  Taking vitamin and mineral supplements as recommended by your health care provider. What happens during an annual well check? The services and screenings done by your health care provider during your annual well check will depend on your age, overall health, lifestyle risk factors, and family history of disease. Counseling  Your health care provider may ask you questions about your:  Alcohol use.  Tobacco use.  Drug use.  Emotional well-being.  Home and relationship well-being.  Sexual activity.  Eating habits.  Work and work environment.  Method of birth control.  Menstrual cycle.  Pregnancy history. Screening  You may have the following tests or measurements:  Height, weight, and BMI.  Diabetes screening. This is done by checking your blood sugar (glucose) after you have not eaten for a while (fasting).  Blood pressure.  Lipid and cholesterol levels. These may be checked every 5 years starting at age 20.  Skin check.  Hepatitis C blood test.  Hepatitis B blood test.  Sexually transmitted disease (STD) testing.  BRCA-related cancer screening. This may be done if you have a family history of breast, ovarian, tubal, or peritoneal cancers.  Pelvic exam and Pap test. This may be done every 3 years starting at age 21. Starting at age 30, this may be done  every 5 years if you have a Pap test in combination with an HPV test. Discuss your test results, treatment options, and if necessary, the need for more tests with your health care provider. Vaccines  Your health care provider may recommend certain vaccines, such as:  Influenza vaccine. This is recommended every year.  Tetanus, diphtheria, and acellular pertussis (Tdap, Td) vaccine. You may need a Td booster every 10 years.  Varicella vaccine. You may need this if you have not been vaccinated.  HPV vaccine. If you are 26 or younger, you may need three doses over 6 months.  Measles, mumps, and rubella (MMR) vaccine. You may need at least one dose of MMR. You may also need a second dose.  Pneumococcal 13-valent conjugate (PCV13) vaccine. You may need this if you have certain conditions and were not previously vaccinated.  Pneumococcal polysaccharide (PPSV23) vaccine. You may need one or two doses if you smoke cigarettes or if you have certain conditions.  Meningococcal vaccine. One dose is recommended if you are age 19-21 years and a first-year college student living in a residence hall, or if you have one of several medical conditions. You may also need additional booster doses.  Hepatitis A vaccine. You may need this if you have certain conditions or if you travel or work in places where you may be exposed to hepatitis A.  Hepatitis B vaccine. You may need this if you have certain conditions or if you travel or work in places where you may be exposed to hepatitis B.  Haemophilus influenzae type b (Hib) vaccine. You may need   this if you have certain risk factors. Talk to your health care provider about which screenings and vaccines you need and how often you need them. This information is not intended to replace advice given to you by your health care provider. Make sure you discuss any questions you have with your health care provider. Document Released: 06/21/2001 Document Revised:  01/13/2016 Document Reviewed: 02/24/2015 Elsevier Interactive Patient Education  2017 Elsevier Inc.  

## 2016-06-07 NOTE — Progress Notes (Signed)
  Subjective:     Natalie Gordon is a 46 y.o. female and is here for a comprehensive physical exam. The patient reports problems - increased appetite with recent separation from husband..  Social History   Social History  . Marital status: Married    Spouse name: N/A  . Number of children: N/A  . Years of education: N/A   Occupational History  . Not on file.   Social History Main Topics  . Smoking status: Former Research scientist (life sciences)  . Smokeless tobacco: Never Used     Comment: quit x 58yrs ago  . Alcohol use Yes     Comment: social  . Drug use: No  . Sexual activity: Yes    Partners: Male    Birth control/ protection: Surgical     Comment: tubaligation.   Other Topics Concern  . Not on file   Social History Narrative  . No narrative on file   Health Maintenance  Topic Date Due  . HIV Screening  05/19/1986  . PAP SMEAR  05/31/2018  . TETANUS/TDAP  10/21/2021  . INFLUENZA VACCINE  Completed    The following portions of the patient's history were reviewed and updated as appropriate: allergies, current medications, past family history, past medical history, past social history, past surgical history and problem list.  Review of Systems Pertinent items noted in HPI and remainder of comprehensive ROS otherwise negative.   Objective:    BP (!) 147/88   Pulse 82   Ht 5\' 4"  (1.626 m)   Wt 202 lb (91.6 kg)   LMP 05/07/2016   BMI 34.67 kg/m  General appearance: alert, cooperative and appears stated age Head: Normocephalic, without obvious abnormality, atraumatic Neck: no adenopathy, supple, symmetrical, trachea midline and thyroid not enlarged, symmetric, no tenderness/mass/nodules Lungs: clear to auscultation bilaterally Breasts: normal appearance, no masses or tenderness Heart: regular rate and rhythm, S1, S2 normal, no murmur, click, rub or gallop Abdomen: soft, non-tender; bowel sounds normal; no masses,  no organomegaly Pelvic: cervix normal in appearance, external  genitalia normal, no adnexal masses or tenderness, no cervical motion tenderness, vagina normal without discharge and uterus feels about 10 wk size and firm Extremities: extremities normal, atraumatic, no cyanosis or edema Pulses: 2+ and symmetric Skin: Skin color, texture, turgor normal. No rashes or lesions Lymph nodes: Cervical, supraclavicular, and axillary nodes normal. Neurologic: Grossly normal    Assessment:    Healthy female exam.      Plan:  White coat HTN -      Problem List Items Addressed This Visit      Unprioritized   Obesity - Primary   Relevant Medications   phentermine 37.5 MG capsule    Other Visit Diagnoses    Encounter for gynecological examination with abnormal finding         Refilled her Phentermine Has had flu shot and pap smear (normal 2017) and labs through work. Declines Mammogram this year. See After Visit Summary for Counseling Recommendations

## 2016-12-21 ENCOUNTER — Other Ambulatory Visit: Payer: Self-pay

## 2016-12-21 MED ORDER — PHENTERMINE HCL 37.5 MG PO CAPS: 37.5000 mg | ORAL_CAPSULE | Freq: Every day | ORAL | 0 refills | Status: DC

## 2016-12-21 NOTE — Telephone Encounter (Signed)
Phentermine called into [facility] per Dr Kennon Rounds.

## 2017-05-10 LAB — TSH
FREE THYROXINE INDEX: 2
T3 Uptake: 23
TSH: 2.69
Thyroxine (T4): 8.5

## 2017-05-10 LAB — LIPID PANEL
CHOL/HDL RATIO: 3.9
Cholesterol, Total: 173
HDL: 44
LDL: 104
TRIGLYCERIDES: 127
VLDL CHOLESTEROL CAL: 25

## 2017-05-10 LAB — VITAMIN D 25 HYDROXY (VIT D DEFICIENCY, FRACTURES): VIT D 25 HYDROXY: 34.4

## 2017-06-20 ENCOUNTER — Ambulatory Visit (INDEPENDENT_AMBULATORY_CARE_PROVIDER_SITE_OTHER): Payer: BLUE CROSS/BLUE SHIELD | Admitting: Family Medicine

## 2017-06-20 ENCOUNTER — Encounter: Payer: Self-pay | Admitting: Family Medicine

## 2017-06-20 ENCOUNTER — Encounter: Payer: Self-pay | Admitting: *Deleted

## 2017-06-20 VITALS — BP 138/87 | HR 72 | Wt 223.6 lb

## 2017-06-20 DIAGNOSIS — Z01419 Encounter for gynecological examination (general) (routine) without abnormal findings: Secondary | ICD-10-CM

## 2017-06-20 DIAGNOSIS — Z124 Encounter for screening for malignant neoplasm of cervix: Secondary | ICD-10-CM

## 2017-06-20 DIAGNOSIS — Z1151 Encounter for screening for human papillomavirus (HPV): Secondary | ICD-10-CM

## 2017-06-20 DIAGNOSIS — R030 Elevated blood-pressure reading, without diagnosis of hypertension: Secondary | ICD-10-CM

## 2017-06-20 DIAGNOSIS — E6609 Other obesity due to excess calories: Secondary | ICD-10-CM

## 2017-06-20 DIAGNOSIS — Z6834 Body mass index (BMI) 34.0-34.9, adult: Secondary | ICD-10-CM

## 2017-06-20 DIAGNOSIS — K921 Melena: Secondary | ICD-10-CM

## 2017-06-20 MED ORDER — PHENTERMINE HCL 37.5 MG PO CAPS: 37.5000 mg | ORAL_CAPSULE | Freq: Every day | ORAL | 1 refills | Status: DC

## 2017-06-20 NOTE — Assessment & Plan Note (Signed)
BP is improved today

## 2017-06-20 NOTE — Progress Notes (Signed)
Subjective:     Natalie Gordon is a 47 y.o. female and is here for a comprehensive physical exam. The patient reports no problems. Doing well, back with her husband. She would like another round of phentermine.  Social History   Socioeconomic History  . Marital status: Married    Spouse name: Not on file  . Number of children: Not on file  . Years of education: Not on file  . Highest education level: Not on file  Social Needs  . Financial resource strain: Not on file  . Food insecurity - worry: Not on file  . Food insecurity - inability: Not on file  . Transportation needs - medical: Not on file  . Transportation needs - non-medical: Not on file  Occupational History  . Not on file  Tobacco Use  . Smoking status: Former Research scientist (life sciences)  . Smokeless tobacco: Never Used  . Tobacco comment: quit x 26yrs ago  Substance and Sexual Activity  . Alcohol use: Yes    Comment: social  . Drug use: No  . Sexual activity: Yes    Partners: Male    Birth control/protection: Surgical    Comment: tubal ligation  Other Topics Concern  . Not on file  Social History Narrative  . Not on file   Health Maintenance  Topic Date Due  . HIV Screening  05/19/1986  . INFLUENZA VACCINE  12/07/2016  . PAP SMEAR  05/31/2018  . TETANUS/TDAP  10/21/2021    The following portions of the patient's history were reviewed and updated as appropriate: allergies, current medications, past family history, past medical history, past social history, past surgical history and problem list.  Review of Systems Pertinent items noted in HPI and remainder of comprehensive ROS otherwise negative.   Objective:    BP 138/87   Pulse 72   Wt 223 lb 9.6 oz (101.4 kg)   LMP 06/02/2017   BMI 38.38 kg/m  General appearance: alert, cooperative and appears stated age Head: Normocephalic, without obvious abnormality, atraumatic Neck: no adenopathy, supple, symmetrical, trachea midline and thyroid not enlarged, symmetric, no  tenderness/mass/nodules Lungs: clear to auscultation bilaterally Breasts: normal appearance, no masses or tenderness Heart: regular rate and rhythm, S1, S2 normal, no murmur, click, rub or gallop Abdomen: soft, non-tender; bowel sounds normal; no masses,  no organomegaly Pelvic: cervix normal in appearance, external genitalia normal, no adnexal masses or tenderness, no cervical motion tenderness, uterus normal size, shape, and consistency and vaginal atrophy Extremities: extremities normal, atraumatic, no cyanosis or edema Pulses: 2+ and symmetric Skin: Skin color, texture, turgor normal. No rashes or lesions Lymph nodes: Cervical, supraclavicular, and axillary nodes normal. Neurologic: Grossly normal    Assessment:    Healthy female exam.      Plan:   Problem List Items Addressed This Visit      Unprioritized   Obesity    One more round of phentermine--advised lifestyle changes--and age and BP should limit use going further.      Relevant Medications   phentermine 37.5 MG capsule   Elevated blood pressure reading in office with white coat syndrome, without diagnosis of hypertension    BP is improved today       Other Visit Diagnoses    Encounter for gynecological examination without abnormal finding    -  Primary   Relevant Orders   MM SCREENING BREAST TOMO BILATERAL   Cytology - PAP   Bloody stool       Relevant Orders  Ambulatory referral to Gastroenterology     Return in 1 year (on 06/20/2018).    See After Visit Summary for Counseling Recommendations

## 2017-06-20 NOTE — Assessment & Plan Note (Signed)
One more round of phentermine--advised lifestyle changes--and age and BP should limit use going further.

## 2017-06-20 NOTE — Progress Notes (Signed)
Normal pap 01/17 Normal MM 04/18

## 2017-06-20 NOTE — Patient Instructions (Signed)
Preventive Care 40-64 Years, Female Preventive care refers to lifestyle choices and visits with your health care provider that can promote health and wellness. What does preventive care include?  A yearly physical exam. This is also called an annual well check.  Dental exams once or twice a year.  Routine eye exams. Ask your health care provider how often you should have your eyes checked.  Personal lifestyle choices, including: ? Daily care of your teeth and gums. ? Regular physical activity. ? Eating a healthy diet. ? Avoiding tobacco and drug use. ? Limiting alcohol use. ? Practicing safe sex. ? Taking low-dose aspirin daily starting at age 58. ? Taking vitamin and mineral supplements as recommended by your health care provider. What happens during an annual well check? The services and screenings done by your health care provider during your annual well check will depend on your age, overall health, lifestyle risk factors, and family history of disease. Counseling Your health care provider may ask you questions about your:  Alcohol use.  Tobacco use.  Drug use.  Emotional well-being.  Home and relationship well-being.  Sexual activity.  Eating habits.  Work and work Statistician.  Method of birth control.  Menstrual cycle.  Pregnancy history.  Screening You may have the following tests or measurements:  Height, weight, and BMI.  Blood pressure.  Lipid and cholesterol levels. These may be checked every 5 years, or more frequently if you are over 81 years old.  Skin check.  Lung cancer screening. You may have this screening every year starting at age 78 if you have a 30-pack-year history of smoking and currently smoke or have quit within the past 15 years.  Fecal occult blood test (FOBT) of the stool. You may have this test every year starting at age 65.  Flexible sigmoidoscopy or colonoscopy. You may have a sigmoidoscopy every 5 years or a colonoscopy  every 10 years starting at age 30.  Hepatitis C blood test.  Hepatitis B blood test.  Sexually transmitted disease (STD) testing.  Diabetes screening. This is done by checking your blood sugar (glucose) after you have not eaten for a while (fasting). You may have this done every 1-3 years.  Mammogram. This may be done every 1-2 years. Talk to your health care provider about when you should start having regular mammograms. This may depend on whether you have a family history of breast cancer.  BRCA-related cancer screening. This may be done if you have a family history of breast, ovarian, tubal, or peritoneal cancers.  Pelvic exam and Pap test. This may be done every 3 years starting at age 80. Starting at age 36, this may be done every 5 years if you have a Pap test in combination with an HPV test.  Bone density scan. This is done to screen for osteoporosis. You may have this scan if you are at high risk for osteoporosis.  Discuss your test results, treatment options, and if necessary, the need for more tests with your health care provider. Vaccines Your health care provider may recommend certain vaccines, such as:  Influenza vaccine. This is recommended every year.  Tetanus, diphtheria, and acellular pertussis (Tdap, Td) vaccine. You may need a Td booster every 10 years.  Varicella vaccine. You may need this if you have not been vaccinated.  Zoster vaccine. You may need this after age 5.  Measles, mumps, and rubella (MMR) vaccine. You may need at least one dose of MMR if you were born in  1956 or later. You may also need a second dose.  Pneumococcal 13-valent conjugate (PCV13) vaccine. You may need this if you have certain conditions and were not previously vaccinated.  Pneumococcal polysaccharide (PPSV23) vaccine. You may need one or two doses if you smoke cigarettes or if you have certain conditions.  Meningococcal vaccine. You may need this if you have certain  conditions.  Hepatitis A vaccine. You may need this if you have certain conditions or if you travel or work in places where you may be exposed to hepatitis A.  Hepatitis B vaccine. You may need this if you have certain conditions or if you travel or work in places where you may be exposed to hepatitis B.  Haemophilus influenzae type b (Hib) vaccine. You may need this if you have certain conditions.  Talk to your health care provider about which screenings and vaccines you need and how often you need them. This information is not intended to replace advice given to you by your health care provider. Make sure you discuss any questions you have with your health care provider. Document Released: 05/22/2015 Document Revised: 01/13/2016 Document Reviewed: 02/24/2015 Elsevier Interactive Patient Education  2018 Elsevier Inc.  

## 2017-06-23 LAB — CYTOLOGY - PAP
Diagnosis: NEGATIVE
HPV (WINDOPATH): NOT DETECTED

## 2017-08-09 ENCOUNTER — Other Ambulatory Visit: Payer: Self-pay

## 2017-08-09 ENCOUNTER — Encounter: Payer: Self-pay | Admitting: *Deleted

## 2017-08-09 ENCOUNTER — Encounter: Payer: Self-pay | Admitting: Gastroenterology

## 2017-08-09 ENCOUNTER — Ambulatory Visit: Payer: BLUE CROSS/BLUE SHIELD | Admitting: Gastroenterology

## 2017-08-09 VITALS — BP 135/84 | HR 94 | Ht 64.0 in | Wt 218.5 lb

## 2017-08-09 DIAGNOSIS — K921 Melena: Secondary | ICD-10-CM

## 2017-08-09 DIAGNOSIS — R140 Abdominal distension (gaseous): Secondary | ICD-10-CM | POA: Diagnosis not present

## 2017-08-09 DIAGNOSIS — R1084 Generalized abdominal pain: Secondary | ICD-10-CM

## 2017-08-09 MED ORDER — PEG 3350-KCL-NA BICARB-NACL 420 G PO SOLR: ORAL | 0 refills | Status: DC

## 2017-08-09 MED ORDER — DICYCLOMINE HCL 10 MG PO CAPS: 10.0000 mg | ORAL_CAPSULE | Freq: Three times a day (TID) | ORAL | 1 refills | Status: DC

## 2017-08-09 NOTE — Progress Notes (Signed)
Gastroenterology Consultation  Referring Provider:     Donnamae Jude, MD Primary Care Physician:  Natalie Butcher, FNP Primary Gastroenterologist:  Dr. Allen Gordon     Reason for Consultation:     Rectal bleeding        HPI:   Natalie Gordon is a 47 y.o. y/o female referred for consultation & management of Rectal bleeding by Dr. Rolena Gordon, Natalie Gordon, [place].  This patient comes in today with a history of rectal bleeding.  She states that the rectal bleeding has been intermittent but has been blood with some small amount of clots.  The patient states that ever since she had her gallbladder removed she has had abdominal discomfort and cramping.  She also reports bloating.  There is no report of any unexplained weight loss fevers chills nausea or vomiting.  The patient also reports that her stools are softer than they have been in the past although she will go between constipation and diarrhea.  There is no family history of any colon cancer colon polyps.  She is concerned because of the rectal bleeding.  The rectal bleeding is painless.  Past Medical History:  Diagnosis Date  . Anxiety   . GERD (gastroesophageal reflux disease)   . History of abnormal Pap smear    At age 24  . Hypertension   . Irregular menstrual cycle   . Morbid obesity ([place])   . PONV (postoperative nausea and vomiting)   . Wears contact lenses     Past Surgical History:  Procedure Laterality Date  . CHOLECYSTECTOMY    . COLPOSCOPY VULVA     abnormal cell.  Marland Kitchen GALLBLADDER SURGERY    . NOVASURE ABLATION  04/25/2012   Procedure: NOVASURE ABLATION;  Surgeon: Natalie Filbert, MD;  Location: [facility];  Service: Gynecology;  Laterality: N/A;  . TUBAL LIGATION  2006    Prior to Admission medications   Medication Sig Start Date End Date Taking? Authorizing Provider  Cholecalciferol (VITAMIN D-1000 MAX ST) 1000 units tablet Take by mouth.   Yes [provider]  fluticasone (FLONASE) 50 MCG/ACT nasal spray Place into both  nostrils daily.   Yes [provider]  Multiple Vitamin (MULTIVITAMIN WITH MINERALS) TABS Take 1 tablet by mouth daily.   Yes [provider]  omeprazole (PRILOSEC) 20 MG capsule Take 20 mg by mouth daily.   Yes [provider]  phentermine 37.5 MG capsule Take 1 capsule (37.5 mg total) by mouth daily. 06/20/17  Yes Natalie Jude, MD  vitamin E 400 UNIT capsule Take by mouth.   Yes [provider]  dicyclomine (BENTYL) 10 MG capsule Take 1 capsule (10 mg total) by mouth 4 (four) times daily -  before meals and at bedtime. 08/09/17 09/08/17  Natalie Lame, MD  hydrochlorothiazide (HYDRODIURIL) 25 MG tablet Take 1 tablet (25 mg total) by mouth daily. 04/17/12   Natalie Filbert, MD  polyethylene glycol-electrolytes (NULYTELY/GOLYTELY) 420 g solution Drink one 8 oz glass every 20 mins until entire container is gone 08/09/17   Natalie Lame, MD    Family History  Problem Relation Age of Onset  . Arthritis Mother   . Cancer Mother        ovarian  . Hypertension Mother   . Depression Mother   . Heart disease Father        heart attack  . Ulcers Maternal Grandmother        bleeding ulcers  . Hypertension Maternal Grandmother   .  Heart disease Maternal Grandfather        heart attack  . Diabetes Maternal Grandfather   . Asthma Maternal Grandfather   . Dementia Maternal Grandfather   . Dementia Paternal Grandmother   . Heart disease Paternal Grandfather        heart attack     Social History   Tobacco Use  . Smoking status: Former Smoker    Last attempt to quit: 2004    Years since quitting: 15.2  . Smokeless tobacco: Never Used  . Tobacco comment:    Substance Use Topics  . Alcohol use: Yes    Alcohol/week: 0.6 oz    Types: 1 Cans of beer per week    Comment: social  . Drug use: No    Allergies as of 08/09/2017  . (No Known Allergies)    Review of Systems:    All systems reviewed and negative except where noted in HPI.   Physical Exam:  BP  135/84   Pulse 94   Ht 5\' 4"  (1.626 m)   Wt 218 lb 8 oz (99.1 kg)   BMI 37.51 kg/m  No LMP recorded. Psych:  Alert and cooperative. Normal mood and affect. General:   Alert,  Well-developed, well-nourished, pleasant and cooperative in NAD Head:  Normocephalic and atraumatic. Eyes:  Sclera clear, no icterus.   Conjunctiva pink. Ears:  Normal auditory acuity. Nose:  No deformity, discharge, or lesions. Mouth:  No deformity or lesions,oropharynx pink & moist. Neck:  Supple; no masses or thyromegaly. Lungs:  Respirations even and unlabored.  Clear throughout to auscultation.   No wheezes, crackles, or rhonchi. No acute distress. Heart:  Regular rate and rhythm; no murmurs, clicks, rubs, or gallops. Abdomen:  Normal bowel sounds.  No bruits.  Soft, non-tender and non-distended without masses, hepatosplenomegaly or hernias noted.  No guarding or rebound tenderness.  Negative Carnett sign.   Rectal:  Deferred.  Msk:  Symmetrical without gross deformities.  Good, equal movement & strength bilaterally. Pulses:  Normal pulses noted. Extremities:  No clubbing or edema.  No cyanosis. Neurologic:  Alert and oriented x3;  grossly normal neurologically. Skin:  Intact without significant lesions or rashes.  No jaundice. Lymph Nodes:  No significant cervical adenopathy. Psych:  Alert and cooperative. Normal mood and affect.  Imaging Studies: No results found.  Assessment and Plan:   Natalie Gordon is a 47 y.o. y/o female who comes in with a history of rectal bleeding. The patient also has bloating and abdominal pain that has been present since her gallbladder was removed.  The patient may have some irritable bowel syndrome and will be started on dicyclomine 10 mg 3 times a day to see if this helps her abdominal pain and bloating.  She will also be set up for colonoscopy due to rectal bleeding. I have discussed risks & benefits which include, but are not limited to, bleeding, infection, perforation &  drug reaction.  The patient agrees with this plan & written consent will be obtained.     Natalie Lame, MD. Natalie Gordon   Note: This dictation was prepared with Dragon dictation along with smaller phrase technology. Any transcriptional errors that result from this process are unintentional.

## 2017-08-10 NOTE — Discharge Instructions (Signed)
General Anesthesia, Adult, Care After °These instructions provide you with information about caring for yourself after your procedure. Your health care provider may also give you more specific instructions. Your treatment has been planned according to current medical practices, but problems sometimes occur. Call your health care provider if you have any problems or questions after your procedure. °What can I expect after the procedure? °After the procedure, it is common to have: °· Vomiting. °· A sore throat. °· Mental slowness. ° °It is common to feel: °· Nauseous. °· Cold or shivery. °· Sleepy. °· Tired. °· Sore or achy, even in parts of your body where you did not have surgery. ° °Follow these instructions at home: °For at least 24 hours after the procedure: °· Do not: °? Participate in activities where you could fall or become injured. °? Drive. °? Use heavy machinery. °? Drink alcohol. °? Take sleeping pills or medicines that cause drowsiness. °? Make important decisions or sign legal documents. °? Take care of children on your own. °· Rest. °Eating and drinking °· If you vomit, drink water, juice, or soup when you can drink without vomiting. °· Drink enough fluid to keep your urine clear or pale yellow. °· Make sure you have little or no nausea before eating solid foods. °· Follow the diet recommended by your health care provider. °General instructions °· Have a responsible adult stay with you until you are awake and alert. °· Return to your normal activities as told by your health care provider. Ask your health care provider what activities are safe for you. °· Take over-the-counter and prescription medicines only as told by your health care provider. °· If you smoke, do not smoke without supervision. °· Keep all follow-up visits as told by your health care provider. This is important. °Contact a health care provider if: °· You continue to have nausea or vomiting at home, and medicines are not helpful. °· You  cannot drink fluids or start eating again. °· You cannot urinate after 8-12 hours. °· You develop a skin rash. °· You have fever. °· You have increasing redness at the site of your procedure. °Get help right away if: °· You have difficulty breathing. °· You have chest pain. °· You have unexpected bleeding. °· You feel that you are having a life-threatening or urgent problem. °This information is not intended to replace advice given to you by your health care provider. Make sure you discuss any questions you have with your health care provider. °Document Released: 08/01/2000 Document Revised: 09/28/2015 Document Reviewed: 04/09/2015 °Elsevier Interactive Patient Education © 2018 Elsevier Inc. ° °

## 2017-08-11 ENCOUNTER — Ambulatory Visit: Payer: BLUE CROSS/BLUE SHIELD | Admitting: Anesthesiology

## 2017-08-11 ENCOUNTER — Ambulatory Visit
Admission: RE | Admit: 2017-08-11 | Discharge: 2017-08-11 | Disposition: A | Discharge status: Home / Self Care | Payer: BLUE CROSS/BLUE SHIELD | Source: Ambulatory Visit | Attending: Gastroenterology | Admitting: Gastroenterology

## 2017-08-11 ENCOUNTER — Encounter
Admission: RE | Disposition: A | Discharge status: Home / Self Care | Payer: Self-pay | Source: Ambulatory Visit | Attending: Gastroenterology

## 2017-08-11 DIAGNOSIS — D124 Benign neoplasm of descending colon: Secondary | ICD-10-CM

## 2017-08-11 DIAGNOSIS — K921 Melena: Secondary | ICD-10-CM

## 2017-08-11 DIAGNOSIS — K635 Polyp of colon: Secondary | ICD-10-CM | POA: Insufficient documentation

## 2017-08-11 DIAGNOSIS — Z79899 Other long term (current) drug therapy: Secondary | ICD-10-CM | POA: Diagnosis not present

## 2017-08-11 DIAGNOSIS — Z1211 Encounter for screening for malignant neoplasm of colon: Secondary | ICD-10-CM | POA: Diagnosis not present

## 2017-08-11 DIAGNOSIS — I10 Essential (primary) hypertension: Secondary | ICD-10-CM | POA: Diagnosis not present

## 2017-08-11 DIAGNOSIS — Z8249 Family history of ischemic heart disease and other diseases of the circulatory system: Secondary | ICD-10-CM | POA: Insufficient documentation

## 2017-08-11 DIAGNOSIS — K219 Gastro-esophageal reflux disease without esophagitis: Secondary | ICD-10-CM | POA: Diagnosis not present

## 2017-08-11 DIAGNOSIS — Z6836 Body mass index (BMI) 36.0-36.9, adult: Secondary | ICD-10-CM | POA: Insufficient documentation

## 2017-08-11 DIAGNOSIS — K641 Second degree hemorrhoids: Secondary | ICD-10-CM | POA: Diagnosis not present

## 2017-08-11 DIAGNOSIS — K5730 Diverticulosis of large intestine without perforation or abscess without bleeding: Secondary | ICD-10-CM | POA: Insufficient documentation

## 2017-08-11 DIAGNOSIS — R0683 Snoring: Secondary | ICD-10-CM | POA: Diagnosis not present

## 2017-08-11 DIAGNOSIS — Z87891 Personal history of nicotine dependence: Secondary | ICD-10-CM | POA: Diagnosis not present

## 2017-08-11 DIAGNOSIS — F419 Anxiety disorder, unspecified: Secondary | ICD-10-CM | POA: Diagnosis not present

## 2017-08-11 DIAGNOSIS — R194 Change in bowel habit: Secondary | ICD-10-CM

## 2017-08-11 HISTORY — DX: Presence of spectacles and contact lenses: Z97.3

## 2017-08-11 HISTORY — DX: Nausea with vomiting, unspecified: Z98.890

## 2017-08-11 HISTORY — DX: Nausea with vomiting, unspecified: R11.2

## 2017-08-11 HISTORY — PX: POLYPECTOMY: SHX149

## 2017-08-11 HISTORY — PX: COLONOSCOPY WITH PROPOFOL: SHX5780

## 2017-08-11 SURGERY — COLONOSCOPY WITH PROPOFOL
Anesthesia: General | Wound class: Contaminated

## 2017-08-11 MED ORDER — LIDOCAINE HCL (CARDIAC) 20 MG/ML IV SOLN
INTRAVENOUS | Status: DC | PRN
  Administered 2017-08-11: 50 mg via INTRAVENOUS

## 2017-08-11 MED ORDER — ACETAMINOPHEN 325 MG PO TABS: 650.0000 mg | ORAL_TABLET | Freq: Once | ORAL | Status: DC | PRN

## 2017-08-11 MED ORDER — PROPOFOL 10 MG/ML IV BOLUS
INTRAVENOUS | Status: DC | PRN
  Administered 2017-08-11: 30 mg via INTRAVENOUS
  Administered 2017-08-11 (×2): 20 mg via INTRAVENOUS
  Administered 2017-08-11: 100 mg via INTRAVENOUS
  Administered 2017-08-11 (×3): 20 mg via INTRAVENOUS
  Administered 2017-08-11 (×2): 30 mg via INTRAVENOUS
  Administered 2017-08-11: 100 mg via INTRAVENOUS
  Administered 2017-08-11: 30 mg via INTRAVENOUS

## 2017-08-11 MED ORDER — ONDANSETRON HCL 4 MG/2ML IJ SOLN: 4.0000 mg | Freq: Once | INTRAMUSCULAR | Status: DC | PRN

## 2017-08-11 MED ORDER — LACTATED RINGERS IV SOLN
INTRAVENOUS | Status: DC
  Administered 2017-08-11: 10:00:00 via INTRAVENOUS

## 2017-08-11 MED ORDER — ACETAMINOPHEN 160 MG/5ML PO SOLN: 325.0000 mg | ORAL | Status: DC | PRN

## 2017-08-11 SURGICAL SUPPLY — 24 items
CANISTER SUCT 1200ML W/VALVE (MISCELLANEOUS) ×4 IMPLANT
CLIP HMST 235XBRD CATH ROT (MISCELLANEOUS) IMPLANT
CLIP RESOLUTION 360 11X235 (MISCELLANEOUS)
ELECT REM PT RETURN 9FT ADLT (ELECTROSURGICAL)
ELECTRODE REM PT RTRN 9FT ADLT (ELECTROSURGICAL) IMPLANT
FCP ESCP3.2XJMB 240X2.8X (MISCELLANEOUS)
FORCEPS BIOP RAD 4 LRG CAP 4 (CUTTING FORCEPS) IMPLANT
FORCEPS BIOP RJ4 240 W/NDL (MISCELLANEOUS)
FORCEPS ESCP3.2XJMB 240X2.8X (MISCELLANEOUS) IMPLANT
GOWN CVR UNV OPN BCK APRN NK (MISCELLANEOUS) ×4 IMPLANT
GOWN ISOL THUMB LOOP REG UNIV (MISCELLANEOUS) ×4
INJECTOR VARIJECT VIN23 (MISCELLANEOUS) IMPLANT
KIT DEFENDO VALVE AND CONN (KITS) IMPLANT
KIT ENDO PROCEDURE OLY (KITS) ×4 IMPLANT
MARKER SPOT ENDO TATTOO 5ML (MISCELLANEOUS) IMPLANT
PROBE APC STR FIRE (PROBE) IMPLANT
RETRIEVER NET ROTH 2.5X230 LF (MISCELLANEOUS) IMPLANT
SNARE SHORT THROW 13M SML OVAL (MISCELLANEOUS) IMPLANT
SNARE SHORT THROW 30M LRG OVAL (MISCELLANEOUS) IMPLANT
SNARE SNG USE RND 15MM (INSTRUMENTS) IMPLANT
SPOT EX ENDOSCOPIC TATTOO (MISCELLANEOUS)
TRAP ETRAP POLY (MISCELLANEOUS) IMPLANT
VARIJECT INJECTOR VIN23 (MISCELLANEOUS)
WATER STERILE IRR 250ML POUR (IV SOLUTION) ×4 IMPLANT

## 2017-08-11 NOTE — Anesthesia Preprocedure Evaluation (Signed)
Anesthesia Evaluation  Patient identified by MRN, date of birth, ID band Patient awake    Reviewed: Allergy & Precautions, NPO status , Patient's Chart, lab work & pertinent test results  History of Anesthesia Complications (+) PONV and history of anesthetic complications  Airway Mallampati: I  TM Distance: >3 FB Neck ROM: Full    Dental  (+)    Pulmonary former smoker (quit 2004),  Snoring    Pulmonary exam normal breath sounds clear to auscultation       Cardiovascular Exercise Tolerance: Good hypertension, Normal cardiovascular exam Rhythm:Regular Rate:Normal     Neuro/Psych PSYCHIATRIC DISORDERS Anxiety    GI/Hepatic GERD  Controlled,  Endo/Other  negative endocrine ROS  Renal/GU negative Renal ROS     Musculoskeletal   Abdominal   Peds  Hematology negative hematology ROS (+)   Anesthesia Other Findings Obesity   Reproductive/Obstetrics                             Anesthesia Physical Anesthesia Plan  ASA: II  Anesthesia Plan: General   Post-op Pain Management:    Induction: Intravenous  PONV Risk Score and Plan: 2 and Propofol infusion and TIVA  Airway Management Planned: Natural Airway  Additional Equipment:   Intra-op Plan:   Post-operative Plan:   Informed Consent: I have reviewed the patients History and Physical, chart, labs and discussed the procedure including the risks, benefits and alternatives for the proposed anesthesia with the patient or authorized representative who has indicated his/her understanding and acceptance.     Plan Discussed with: CRNA  Anesthesia Plan Comments:         Anesthesia Quick Evaluation

## 2017-08-11 NOTE — Anesthesia Postprocedure Evaluation (Signed)
Anesthesia Post Note  Patient: Natalie Gordon  Procedure(s) Performed: COLONOSCOPY WITH PROPOFOL (N/A ) POLYPECTOMY INTESTINAL  Patient location during evaluation: PACU Anesthesia Type: General Level of consciousness: awake and alert, oriented and patient cooperative Pain management: pain level controlled Vital Signs Assessment: post-procedure vital signs reviewed and stable Respiratory status: spontaneous breathing, nonlabored ventilation and respiratory function stable Cardiovascular status: blood pressure returned to baseline and stable Postop Assessment: adequate PO intake Anesthetic complications: no    Darrin Nipper

## 2017-08-11 NOTE — Transfer of Care (Signed)
Immediate Anesthesia Transfer of Care Note  Patient: Natalie Gordon  Procedure(s) Performed: COLONOSCOPY WITH PROPOFOL (N/A )  Patient Location: PACU  Anesthesia Type: General  Level of Consciousness: awake, alert  and patient cooperative  Airway and Oxygen Therapy: Patient Spontanous Breathing and Patient connected to supplemental oxygen  Post-op Assessment: Post-op Vital signs reviewed, Patient's Cardiovascular Status Stable, Respiratory Function Stable, Patent Airway and No signs of Nausea or vomiting  Post-op Vital Signs: Reviewed and stable  Complications: No apparent anesthesia complications

## 2017-08-11 NOTE — H&P (Signed)
Lucilla Lame, MD [address] [place], [place] [address] Phone:[phone] Fax : [phone]  Primary Care Physician:  Serita Butcher, FNP Primary Gastroenterologist:  Dr. Allen Norris  Pre-Procedure History & Physical: HPI:  Natalie Gordon is a 47 y.o. female is here for an colonoscopy.   Past Medical History:  Diagnosis Date  . Anxiety   . GERD (gastroesophageal reflux disease)   . History of abnormal Pap smear    At age 2  . Hypertension   . Irregular menstrual cycle   . Morbid obesity ([place])   . PONV (postoperative nausea and vomiting)   . Wears contact lenses     Past Surgical History:  Procedure Laterality Date  . CHOLECYSTECTOMY    . COLPOSCOPY VULVA     abnormal cell.  Marland Kitchen GALLBLADDER SURGERY    . NOVASURE ABLATION  04/25/2012   Procedure: NOVASURE ABLATION;  Surgeon: Emily Filbert, MD;  Location: [facility];  Service: Gynecology;  Laterality: N/A;  . TUBAL LIGATION  2006    Prior to Admission medications   Medication Sig Start Date End Date Taking? Authorizing Provider  Cholecalciferol (VITAMIN D-1000 MAX ST) 1000 units tablet Take by mouth.   Yes [provider]  dicyclomine (BENTYL) 10 MG capsule Take 1 capsule (10 mg total) by mouth 4 (four) times daily -  before meals and at bedtime. 08/09/17 09/08/17 Yes Daneesha Quinteros, MD  fluticasone (FLONASE) 50 MCG/ACT nasal spray Place into both nostrils daily.   Yes [provider]  hydrochlorothiazide (HYDRODIURIL) 25 MG tablet Take 1 tablet (25 mg total) by mouth daily. 04/17/12  Yes Dove, Myra C, MD  Multiple Vitamin (MULTIVITAMIN WITH MINERALS) TABS Take 1 tablet by mouth daily.   Yes [provider]  omeprazole (PRILOSEC) 20 MG capsule Take 20 mg by mouth daily.   Yes [provider]  phentermine 37.5 MG capsule Take 1 capsule (37.5 mg total) by mouth daily. 06/20/17  Yes Donnamae Jude, MD  vitamin E 400 UNIT capsule Take by mouth.   Yes [provider]    polyethylene glycol-electrolytes (NULYTELY/GOLYTELY) 420 g solution Drink one 8 oz glass every 20 mins until entire container is gone 08/09/17   Lucilla Lame, MD    Allergies as of 08/09/2017  . (No Known Allergies)    Family History  Problem Relation Age of Onset  . Arthritis Mother   . Cancer Mother        ovarian  . Hypertension Mother   . Depression Mother   . Heart disease Father        heart attack  . Ulcers Maternal Grandmother        bleeding ulcers  . Hypertension Maternal Grandmother   . Heart disease Maternal Grandfather        heart attack  . Diabetes Maternal Grandfather   . Asthma Maternal Grandfather   . Dementia Maternal Grandfather   . Dementia Paternal Grandmother   . Heart disease Paternal Grandfather        heart attack    Social History   Socioeconomic History  . Marital status: Married    Spouse name: Not on file  . Number of children: Not on file  . Years of education: Not on file  . Highest education level: Not on file  Occupational History  . Not on file  Social Needs  . Financial resource strain: Not on file  . Food insecurity:    Worry: Not on file  Inability: Not on file  . Transportation needs:    Medical: Not on file    Non-medical: Not on file  Tobacco Use  . Smoking status: Former Smoker    Last attempt to quit: 2004    Years since quitting: 15.2  . Smokeless tobacco: Never Used  . Tobacco comment:    Substance and Sexual Activity  . Alcohol use: Yes    Alcohol/week: 0.6 oz    Types: 1 Cans of beer per week    Comment: social  . Drug use: No  . Sexual activity: Yes    Partners: Male    Birth control/protection: Surgical    Comment: tubal ligation  Lifestyle  . Physical activity:    Days per week: Not on file    Minutes per session: Not on file  . Stress: Not on file  Relationships  . Social connections:    Talks on phone: Not on file    Gets together: Not on file    Attends religious service: Not on file     Active member of club or organization: Not on file    Attends meetings of clubs or organizations: Not on file    Relationship status: Not on file  . Intimate partner violence:    Fear of current or ex partner: Not on file    Emotionally abused: Not on file    Physically abused: Not on file    Forced sexual activity: Not on file  Other Topics Concern  . Not on file  Social History Narrative  . Not on file    Review of Systems: See HPI, otherwise negative ROS  Physical Exam: BP (!) 134/93   Pulse 88   Temp 98.1 F (36.7 C) (Temporal)   Ht 5\' 4"  (1.626 m)   Wt 214 lb (97.1 kg)   LMP 07/09/2017 (Approximate) Comment: preg test negative  SpO2 98%   BMI 36.73 kg/m  General:   Alert,  pleasant and cooperative in NAD Head:  Normocephalic and atraumatic. Neck:  Supple; no masses or thyromegaly. Lungs:  Clear throughout to auscultation.    Heart:  Regular rate and rhythm. Abdomen:  Soft, nontender and nondistended. Normal bowel sounds, without guarding, and without rebound.   Neurologic:  Alert and  oriented x4;  grossly normal neurologically.  Impression/Plan: Natalie Gordon is here for an colonoscopy to be performed for hematochezia  Risks, benefits, limitations, and alternatives regarding  colonoscopy have been reviewed with the patient.  Questions have been answered.  All parties agreeable.   Lucilla Lame, MD  08/11/2017, 9:43 AM

## 2017-08-11 NOTE — Anesthesia Procedure Notes (Signed)
Procedure Name: MAC Date/Time: 08/11/2017 10:13 AM Performed by: Janna Arch, CRNA Pre-anesthesia Checklist: Patient identified, Emergency Drugs available, Suction available and Patient being monitored Patient Re-evaluated:Patient Re-evaluated prior to induction Oxygen Delivery Method: Nasal cannula

## 2017-08-11 NOTE — Op Note (Signed)
[facility] Patient Name: Natalie Gordon Procedure Date: 08/11/2017 10:08 AM MRN: [id number] Account #: [id number] Date of Birth: 27-Apr-1971 Admit Type: Outpatient Age: 47 Room: [facility] OR ROOM 01 Gender: Female Note Status: Finalized Procedure:            Colonoscopy Indications:          Hematochezia, Change in bowel habits Providers:            Lucilla Lame MD, MD Referring MD:         Georgiann Mohs. Rolena Infante FNP, FNP (Referring MD) Medicines:            Propofol per Anesthesia Complications:        No immediate complications. Procedure:            Pre-Anesthesia Assessment:                       - Prior to the procedure, a History and Physical was                        performed, and patient medications and allergies were                        reviewed. The patient's tolerance of previous                        anesthesia was also reviewed. The risks and benefits of                        the procedure and the sedation options and risks were                        discussed with the patient. All questions were                        answered, and informed consent was obtained. Prior                        Anticoagulants: The patient has taken no previous                        anticoagulant or antiplatelet agents. ASA Grade                        Assessment: II - A patient with mild systemic disease.                        After reviewing the risks and benefits, the patient was                        deemed in satisfactory condition to undergo the                        procedure.                       After obtaining informed consent, the colonoscope was                        passed under direct vision. Throughout the procedure,  the patient's blood pressure, pulse, and oxygen                        saturations were monitored continuously. The Olympus                        CF-HQ190L Colonoscope (S#. [id number]) was introduced                    through the anus and advanced to the the terminal                        ileum. The colonoscopy was performed without                        difficulty. The patient tolerated the procedure well.                        The quality of the bowel preparation was excellent. Findings:      The perianal and digital rectal examinations were normal.      A 3 mm polyp was found in the descending colon. The polyp was sessile.       The polyp was removed with a cold biopsy forceps. Resection and       retrieval were complete.      The terminal ileum appeared normal. Random biopsies were obtained with       cold forceps for histology.      Non-bleeding internal hemorrhoids were found during retroflexion. The       hemorrhoids were Grade II (internal hemorrhoids that prolapse but reduce       spontaneously).      Random biopsies were obtained with cold forceps for histology randomly       in the entire colon.      A few small-mouthed diverticula were found in the sigmoid colon. Impression:           - One 3 mm polyp in the descending colon, removed with                        a cold biopsy forceps. Resected and retrieved.                       - The examined portion of the ileum was normal.                       - Non-bleeding internal hemorrhoids.                       - Diverticulosis in the sigmoid colon.                       - Random biopsies were obtained.                       - Random biopsies were obtained in the entire colon. Recommendation:       - Discharge patient to home.                       - Resume previous diet.                       - Continue  present medications.                       - Await pathology results. Procedure Code(s):    --- Professional ---                       [phone], Colonoscopy, flexible; with biopsy, single or                        multiple Diagnosis Code(s):    --- Professional ---                       K92.1, Melena (includes Hematochezia)                        R19.4, Change in bowel habit                       D12.4, Benign neoplasm of descending colon CPT copyright 2017 American Medical Association. All rights reserved. The codes documented in this report are preliminary and upon coder review may  be revised to meet current compliance requirements. Lucilla Lame MD, MD 08/11/2017 10:38:35 AM This report has been signed electronically. Number of Addenda: 0 Note Initiated On: 08/11/2017 10:08 AM Scope Withdrawal Time: 0 hours 9 minutes 9 seconds  Total Procedure Duration: 0 hours 13 minutes 40 seconds       [facility]

## 2017-08-14 ENCOUNTER — Encounter: Payer: Self-pay | Admitting: Gastroenterology

## 2017-09-12 DIAGNOSIS — Z1231 Encounter for screening mammogram for malignant neoplasm of breast: Secondary | ICD-10-CM | POA: Diagnosis not present

## 2017-09-13 ENCOUNTER — Encounter: Payer: Self-pay | Admitting: Radiology

## 2017-10-27 ENCOUNTER — Other Ambulatory Visit: Payer: Self-pay | Admitting: Gastroenterology

## 2017-10-27 MED ORDER — DICYCLOMINE HCL 10 MG PO CAPS: 10.0000 mg | ORAL_CAPSULE | Freq: Three times a day (TID) | ORAL | 1 refills | Status: DC

## 2018-01-05 ENCOUNTER — Telehealth: Payer: Self-pay | Admitting: Gastroenterology

## 2018-01-05 NOTE — Telephone Encounter (Signed)
PT  Is scheduled for [facility] rx for 10/3 she needs refill until apt for dicyclomine send to [place] in [facility]

## 2018-01-09 ENCOUNTER — Other Ambulatory Visit: Payer: Self-pay

## 2018-01-09 MED ORDER — DICYCLOMINE HCL 10 MG PO CAPS: 10.0000 mg | ORAL_CAPSULE | Freq: Three times a day (TID) | ORAL | 2 refills | Status: DC

## 2018-01-09 NOTE — Telephone Encounter (Signed)
Refill for dicyclomine has been sent to [facility], [place] per pt request.

## 2018-01-09 NOTE — Progress Notes (Unsigned)
am

## 2018-02-08 ENCOUNTER — Encounter: Payer: Self-pay | Admitting: Gastroenterology

## 2018-02-08 ENCOUNTER — Ambulatory Visit (INDEPENDENT_AMBULATORY_CARE_PROVIDER_SITE_OTHER): Payer: BLUE CROSS/BLUE SHIELD | Admitting: Gastroenterology

## 2018-02-08 VITALS — BP 141/81 | HR 86 | Ht 64.0 in | Wt 223.0 lb

## 2018-02-08 DIAGNOSIS — R112 Nausea with vomiting, unspecified: Secondary | ICD-10-CM | POA: Diagnosis not present

## 2018-02-08 MED ORDER — DICYCLOMINE HCL 20 MG PO TABS: 20.0000 mg | ORAL_TABLET | Freq: Three times a day (TID) | ORAL | 3 refills | Status: DC

## 2018-02-08 NOTE — Progress Notes (Signed)
Primary Care Physician: Serita Butcher, FNP  Primary Gastroenterologist:  Dr. Lucilla Lame  Chief Complaint  Patient presents with  . Medication Refill    HPI: Natalie Gordon is a 47 y.o. female here for follow-up of her abdominal bloating after she eats.  The patient reports that her symptoms are most commonly felt after she eats meat.  She then states she will eat chicken and not have any problems.  She states that shortly after eating meat she will have lower abdominal pain with cramping and sometimes will have to run to the bathroom and have diarrhea.  The patient was given dicyclomine 10 mg to be taken before meals and she states that that has not helped her completely.  The patient denies any unexplained weight loss fevers chills nausea or vomiting.  Current Outpatient Medications  Medication Sig Dispense Refill  . Cholecalciferol (VITAMIN D-1000 MAX ST) 1000 units tablet Take by mouth.    . fluticasone (FLONASE) 50 MCG/ACT nasal spray Place into both nostrils daily.    . hydrochlorothiazide (HYDRODIURIL) 25 MG tablet Take 1 tablet (25 mg total) by mouth daily. 30 tablet 6  . Multiple Vitamin (MULTIVITAMIN WITH MINERALS) TABS Take 1 tablet by mouth daily.    Marland Kitchen omeprazole (PRILOSEC) 20 MG capsule Take 20 mg by mouth daily.    Marland Kitchen VITAMIN E PO Take by mouth.    . dicyclomine (BENTYL) 20 MG tablet Take 1 tablet (20 mg total) by mouth 3 (three) times daily before meals. 90 tablet 3  . phentermine 37.5 MG capsule Take 1 capsule (37.5 mg total) by mouth daily. (Patient not taking: Reported on 02/08/2018) 30 capsule 1  . polyethylene glycol-electrolytes (NULYTELY/GOLYTELY) 420 g solution Drink one 8 oz glass every 20 mins until entire container is gone (Patient not taking: Reported on 02/08/2018) 4000 mL 0   No current facility-administered medications for this visit.     Allergies as of 02/08/2018  . (No Known Allergies)    ROS:  General: Negative for anorexia, weight loss,  fever, chills, fatigue, weakness. ENT: Negative for hoarseness, difficulty swallowing , nasal congestion. CV: Negative for chest pain, angina, palpitations, dyspnea on exertion, peripheral edema.  Respiratory: Negative for dyspnea at rest, dyspnea on exertion, cough, sputum, wheezing.  GI: See history of present illness. GU:  Negative for dysuria, hematuria, urinary incontinence, urinary frequency, nocturnal urination.  Endo: Negative for unusual weight change.    Physical Examination:   BP (!) 141/81   Pulse 86   Ht 5\' 4"  (1.626 m)   Wt 223 lb (101.2 kg)   BMI 38.28 kg/m   General: Well-nourished, well-developed in no acute distress.  Eyes: No icterus. Conjunctivae pink. Mouth: Oropharyngeal mucosa moist and pink , no lesions erythema or exudate. Lungs: Clear to auscultation bilaterally. Non-labored. Heart: Regular rate and rhythm, no murmurs rubs or gallops.  Abdomen: Bowel sounds are normal, nontender, nondistended, no hepatosplenomegaly or masses, no abdominal bruits or hernia , no rebound or guarding.   Extremities: No lower extremity edema. No clubbing or deformities. Neuro: Alert and oriented x 3.  Grossly intact. Skin: Warm and dry, no jaundice.   Psych: Alert and cooperative, normal mood and affect.  Labs:    Imaging Studies: No results found.  Assessment and Plan:   Natalie Gordon is a 47 y.o. y/o female who comes in today for follow-up of lower abdominal cramping after she eats.  The patient does not have symptoms unless she is eating.  She also reports that it will usually happen only with meat.  It does not usually happen with other fatty foods and the patient does well with most of her other meals.  The patient will be started on an increased dose of dicyclomine of 20 mg 3 times a day to see if this helps her symptoms.  The patient will also have her blood sent off for alpha gal to make sure she is not having a allergy to meat since it seems to be the only thing  that is causing her problems.  The patient has been explained the plan and agrees with it.    Lucilla Lame, MD. Marval Regal   Note: This dictation was prepared with Dragon dictation along with smaller phrase technology. Any transcriptional errors that result from this process are unintentional.

## 2018-02-13 ENCOUNTER — Telehealth: Payer: Self-pay

## 2018-02-13 LAB — ALPHA-GAL PANEL
Beef (Bos spp) IgE: <0.1 kU/L (ref <0.35)
Class Interpretation: 0
Class Interpretation: 0
Lamb/Mutton (Ovis spp) IgE: <0.1 kU/L (ref <0.35)
PORK CLASS INTERPRETATION: 0
Pork (Sus spp) IgE: <0.1 kU/L (ref <0.35)

## 2018-02-13 NOTE — Telephone Encounter (Signed)
Left vm with lab results. 

## 2018-02-13 NOTE — Telephone Encounter (Signed)
-----   Message from Lucilla Lame, MD sent at 02/13/2018 12:10 PM EDT ----- Let the patient know the alpha gal was negative.

## 2018-06-25 ENCOUNTER — Ambulatory Visit (INDEPENDENT_AMBULATORY_CARE_PROVIDER_SITE_OTHER): Payer: BLUE CROSS/BLUE SHIELD | Admitting: Family Medicine

## 2018-06-25 ENCOUNTER — Encounter: Payer: Self-pay | Admitting: Family Medicine

## 2018-06-25 VITALS — BP 132/84 | HR 72 | Wt 228.2 lb

## 2018-06-25 DIAGNOSIS — E6609 Other obesity due to excess calories: Secondary | ICD-10-CM

## 2018-06-25 DIAGNOSIS — Z6834 Body mass index (BMI) 34.0-34.9, adult: Secondary | ICD-10-CM

## 2018-06-25 DIAGNOSIS — Z01419 Encounter for gynecological examination (general) (routine) without abnormal findings: Secondary | ICD-10-CM

## 2018-06-25 DIAGNOSIS — I10 Essential (primary) hypertension: Secondary | ICD-10-CM

## 2018-06-25 MED ORDER — PHENTERMINE HCL 37.5 MG PO CAPS: 37.5000 mg | ORAL_CAPSULE | Freq: Every day | ORAL | 1 refills | Status: DC

## 2018-06-25 NOTE — Progress Notes (Signed)
Last pap 06/20/2017- normal  Discuss Phentermine Mammogram complete at work

## 2018-06-25 NOTE — Progress Notes (Signed)
  Subjective:     Natalie Gordon is a 48 y.o. female and is here for a comprehensive physical exam. The patient reports problems - weight gain. Went for colonoscopy, few polyps. On Bentyl, which helps some.   The following portions of the patient's history were reviewed and updated as appropriate: allergies, current medications, past family history, past medical history, past social history, past surgical history and problem list.  Review of Systems Pertinent items noted in HPI and remainder of comprehensive ROS otherwise negative.   Objective:    BP 132/84   Pulse 72   Wt 228 lb 3.2 oz (103.5 kg)   LMP 06/05/2018   BMI 39.17 kg/m  General appearance: alert, cooperative, appears stated age and moderately obese Head: Normocephalic, without obvious abnormality, atraumatic Neck: no adenopathy, supple, symmetrical, trachea midline and thyroid not enlarged, symmetric, no tenderness/mass/nodules Lungs: clear to auscultation bilaterally Breasts: normal appearance, no masses or tenderness Heart: regular rate and rhythm, S1, S2 normal, no murmur, click, rub or gallop Abdomen: soft, non-tender; bowel sounds normal; no masses,  no organomegaly Pelvic: cervix normal in appearance, external genitalia normal, no adnexal masses or tenderness, no cervical motion tenderness, uterus normal size, shape, and consistency and vagina normal without discharge Extremities: extremities normal, atraumatic, no cyanosis or edema Pulses: 2+ and symmetric Skin: Skin color, texture, turgor normal. No rashes or lesions Lymph nodes: Cervical, supraclavicular, and axillary nodes normal. Neurologic: Grossly normal   Nml Labs recently through work Mammogram through work Assessment:    Healthy female exam.      Plan:      Problem List Items Addressed This Visit      Unprioritized   Obesity    [place] for 2 months of Phentermine--emphasized lifestyle changes. To initiate exercise program.      Relevant  Medications   phentermine 37.5 MG capsule   Essential hypertension    BP is well controlled, on HCTZ       Other Visit Diagnoses    Encounter for gynecological examination without abnormal finding    -  Primary     Return in 1 year (on 06/26/2019).   See After Visit Summary for Counseling Recommendations

## 2018-06-25 NOTE — Patient Instructions (Signed)
Preventive Care 40-64 Years, Female Preventive care refers to lifestyle choices and visits with your health care provider that can promote health and wellness. What does preventive care include?   A yearly physical exam. This is also called an annual well check.  Dental exams once or twice a year.  Routine eye exams. Ask your health care provider how often you should have your eyes checked.  Personal lifestyle choices, including: ? Daily care of your teeth and gums. ? Regular physical activity. ? Eating a healthy diet. ? Avoiding tobacco and drug use. ? Limiting alcohol use. ? Practicing safe sex. ? Taking low-dose aspirin daily starting at age 50. ? Taking vitamin and mineral supplements as recommended by your health care provider. What happens during an annual well check? The services and screenings done by your health care provider during your annual well check will depend on your age, overall health, lifestyle risk factors, and family history of disease. Counseling Your health care provider may ask you questions about your:  Alcohol use.  Tobacco use.  Drug use.  Emotional well-being.  Home and relationship well-being.  Sexual activity.  Eating habits.  Work and work environment.  Method of birth control.  Menstrual cycle.  Pregnancy history. Screening You may have the following tests or measurements:  Height, weight, and BMI.  Blood pressure.  Lipid and cholesterol levels. These may be checked every 5 years, or more frequently if you are over 50 years old.  Skin check.  Lung cancer screening. You may have this screening every year starting at age 55 if you have a 30-pack-year history of smoking and currently smoke or have quit within the past 15 years.  Colorectal cancer screening. All adults should have this screening starting at age 50 and continuing until age 75. Your health care provider may recommend screening at age 45. You will have tests every  1-10 years, depending on your results and the type of screening test. People at increased risk should start screening at an earlier age. Screening tests may include: ? Guaiac-based fecal occult blood testing. ? Fecal immunochemical test (FIT). ? Stool DNA test. ? Virtual colonoscopy. ? Sigmoidoscopy. During this test, a flexible tube with a tiny camera (sigmoidoscope) is used to examine your rectum and lower colon. The sigmoidoscope is inserted through your anus into your rectum and lower colon. ? Colonoscopy. During this test, a long, thin, flexible tube with a tiny camera (colonoscope) is used to examine your entire colon and rectum.  Hepatitis C blood test.  Hepatitis B blood test.  Sexually transmitted disease (STD) testing.  Diabetes screening. This is done by checking your blood sugar (glucose) after you have not eaten for a while (fasting). You may have this done every 1-3 years.  Mammogram. This may be done every 1-2 years. Talk to your health care provider about when you should start having regular mammograms. This may depend on whether you have a family history of breast cancer.  BRCA-related cancer screening. This may be done if you have a family history of breast, ovarian, tubal, or peritoneal cancers.  Pelvic exam and Pap test. This may be done every 3 years starting at age 21. Starting at age 30, this may be done every 5 years if you have a Pap test in combination with an HPV test.  Bone density scan. This is done to screen for osteoporosis. You may have this scan if you are at high risk for osteoporosis. Discuss your test results, treatment options,   and if necessary, the need for more tests with your health care provider. Vaccines Your health care provider may recommend certain vaccines, such as:  Influenza vaccine. This is recommended every year.  Tetanus, diphtheria, and acellular pertussis (Tdap, Td) vaccine. You may need a Td booster every 10 years.  Varicella  vaccine. You may need this if you have not been vaccinated.  Zoster vaccine. You may need this after age 38.  Measles, mumps, and rubella (MMR) vaccine. You may need at least one dose of MMR if you were born in 1957 or later. You may also need a second dose.  Pneumococcal 13-valent conjugate (PCV13) vaccine. You may need this if you have certain conditions and were not previously vaccinated.  Pneumococcal polysaccharide (PPSV23) vaccine. You may need one or two doses if you smoke cigarettes or if you have certain conditions.  Meningococcal vaccine. You may need this if you have certain conditions.  Hepatitis A vaccine. You may need this if you have certain conditions or if you travel or work in places where you may be exposed to hepatitis A.  Hepatitis B vaccine. You may need this if you have certain conditions or if you travel or work in places where you may be exposed to hepatitis B.  Haemophilus influenzae type b (Hib) vaccine. You may need this if you have certain conditions. Talk to your health care provider about which screenings and vaccines you need and how often you need them. This information is not intended to replace advice given to you by your health care provider. Make sure you discuss any questions you have with your health care provider. Document Released: 05/22/2015 Document Revised: 06/15/2017 Document Reviewed: 02/24/2015 Elsevier Interactive Patient Education  2019 [employer].

## 2018-06-26 ENCOUNTER — Other Ambulatory Visit: Payer: Self-pay | Admitting: Gastroenterology

## 2018-06-26 NOTE — Assessment & Plan Note (Signed)
[place] for 2 months of Phentermine--emphasized lifestyle changes. To initiate exercise program.

## 2018-06-26 NOTE — Assessment & Plan Note (Addendum)
BP is well controlled, on HCTZ

## 2018-11-02 ENCOUNTER — Other Ambulatory Visit: Payer: Self-pay | Admitting: Family Medicine

## 2018-11-02 DIAGNOSIS — Z1231 Encounter for screening mammogram for malignant neoplasm of breast: Secondary | ICD-10-CM

## 2018-11-05 ENCOUNTER — Other Ambulatory Visit: Payer: Self-pay | Admitting: Gastroenterology

## 2018-11-19 ENCOUNTER — Other Ambulatory Visit: Payer: Self-pay

## 2018-11-19 ENCOUNTER — Ambulatory Visit
Admission: RE | Admit: 2018-11-19 | Discharge: 2018-11-19 | Disposition: A | Discharge status: Home / Self Care | Payer: BC Managed Care – PPO | Source: Ambulatory Visit | Attending: Family Medicine | Admitting: Family Medicine

## 2018-11-19 DIAGNOSIS — Z1231 Encounter for screening mammogram for malignant neoplasm of breast: Secondary | ICD-10-CM | POA: Diagnosis not present

## 2019-03-12 ENCOUNTER — Other Ambulatory Visit: Payer: Self-pay | Admitting: Gastroenterology

## 2019-05-13 DIAGNOSIS — Z0189 Encounter for other specified special examinations: Secondary | ICD-10-CM | POA: Diagnosis not present

## 2019-05-17 ENCOUNTER — Other Ambulatory Visit: Payer: Self-pay | Admitting: Gastroenterology

## 2019-05-20 DIAGNOSIS — Z043 Encounter for examination and observation following other accident: Secondary | ICD-10-CM | POA: Diagnosis not present

## 2019-05-20 DIAGNOSIS — I10 Essential (primary) hypertension: Secondary | ICD-10-CM | POA: Diagnosis not present

## 2019-05-20 DIAGNOSIS — R7303 Prediabetes: Secondary | ICD-10-CM | POA: Diagnosis not present

## 2019-05-21 DIAGNOSIS — K649 Unspecified hemorrhoids: Secondary | ICD-10-CM | POA: Diagnosis not present

## 2019-05-21 NOTE — Telephone Encounter (Signed)
Patient called & would like a refill on   dicyclomine (BENTYL) 20 MG tablet    Called in to [facility] in [place]

## 2019-07-16 ENCOUNTER — Ambulatory Visit (INDEPENDENT_AMBULATORY_CARE_PROVIDER_SITE_OTHER): Payer: BC Managed Care – PPO | Admitting: Family Medicine

## 2019-07-16 ENCOUNTER — Encounter: Payer: Self-pay | Admitting: Family Medicine

## 2019-07-16 ENCOUNTER — Other Ambulatory Visit: Payer: Self-pay

## 2019-07-16 VITALS — BP 145/95 | HR 74 | Wt 232.4 lb

## 2019-07-16 DIAGNOSIS — Z01419 Encounter for gynecological examination (general) (routine) without abnormal findings: Secondary | ICD-10-CM | POA: Diagnosis not present

## 2019-07-16 DIAGNOSIS — E6609 Other obesity due to excess calories: Secondary | ICD-10-CM | POA: Diagnosis not present

## 2019-07-16 DIAGNOSIS — R030 Elevated blood-pressure reading, without diagnosis of hypertension: Secondary | ICD-10-CM | POA: Diagnosis not present

## 2019-07-16 DIAGNOSIS — Z6834 Body mass index (BMI) 34.0-34.9, adult: Secondary | ICD-10-CM

## 2019-07-16 MED ORDER — PHENTERMINE HCL 37.5 MG PO CAPS: 37.5000 mg | ORAL_CAPSULE | Freq: Every day | ORAL | 2 refills | Status: DC

## 2019-07-16 NOTE — Progress Notes (Signed)
Last pap 06/09/2017- negative No STI  Lab work completed 05/29/2019

## 2019-07-16 NOTE — Assessment & Plan Note (Signed)
Keep eye on BP--and especially with Phentermine.

## 2019-07-16 NOTE — Progress Notes (Signed)
  Subjective:     Natalie Gordon is a 49 y.o. female and is here for a comprehensive physical exam. The patient reports problems - weight gain, desires phentermine. Working from home. Going well. Son graduating from college this year. Other son struggling with online school..    The following portions of the patient's history were reviewed and updated as appropriate: allergies, current medications, past family history, past medical history, past social history, past surgical history and problem list.  Review of Systems Pertinent items noted in HPI and remainder of comprehensive ROS otherwise negative.   Objective:    BP (!) 145/95   Pulse 74   Wt 232 lb 6.4 oz (105.4 kg)   BMI 39.89 kg/m  General appearance: alert, cooperative and appears stated age Head: Normocephalic, without obvious abnormality, atraumatic Neck: no adenopathy, supple, symmetrical, trachea midline and thyroid not enlarged, symmetric, no tenderness/mass/nodules Lungs: clear to auscultation bilaterally Breasts: normal appearance, no masses or tenderness Heart: regular rate and rhythm, S1, S2 normal, no murmur, click, rub or gallop Abdomen: soft, non-tender; bowel sounds normal; no masses,  no organomegaly Pelvic: cervix normal in appearance, external genitalia normal, no adnexal masses or tenderness, no cervical motion tenderness, uterus normal size, shape, and consistency and vagina normal without discharge Extremities: extremities normal, atraumatic, no cyanosis or edema Pulses: 2+ and symmetric Skin: Skin color, texture, turgor normal. No rashes or lesions Lymph nodes: Cervical, supraclavicular, and axillary nodes normal. Neurologic: Grossly normal    Assessment:    Healthy female exam.      Plan:      Problem List Items Addressed This Visit      Unprioritized   Obesity   Relevant Medications   phentermine 37.5 MG capsule   Elevated blood pressure reading in office with white coat syndrome, without  diagnosis of hypertension    Keep eye on BP--and especially with Phentermine.       Other Visit Diagnoses    Encounter for gynecological examination without abnormal finding    -  Primary     Return in 1 year (on 07/15/2020).  See After Visit Summary for Counseling Recommendations

## 2019-07-31 ENCOUNTER — Other Ambulatory Visit: Payer: Self-pay | Admitting: Gastroenterology

## 2019-08-26 DIAGNOSIS — J301 Allergic rhinitis due to pollen: Secondary | ICD-10-CM | POA: Diagnosis not present

## 2019-08-26 DIAGNOSIS — J019 Acute sinusitis, unspecified: Secondary | ICD-10-CM | POA: Diagnosis not present

## 2019-08-26 DIAGNOSIS — R519 Headache, unspecified: Secondary | ICD-10-CM | POA: Diagnosis not present

## 2019-10-08 ENCOUNTER — Other Ambulatory Visit: Payer: Self-pay | Admitting: Gastroenterology

## 2019-10-08 NOTE — Telephone Encounter (Signed)
dicyclomine (BENTYL) 20 MG tablet  Has an appt in Aug 2021  [facility] [place]

## 2019-10-08 NOTE — Telephone Encounter (Signed)
Dicyclomine 20mg  has been sent to pt's pharmacy. Pt has been scheduled for an appt and is aware no further refills will be approved until appt.

## 2019-10-28 ENCOUNTER — Other Ambulatory Visit: Payer: Self-pay | Admitting: Family Medicine

## 2019-10-28 DIAGNOSIS — Z1231 Encounter for screening mammogram for malignant neoplasm of breast: Secondary | ICD-10-CM

## 2019-11-04 DIAGNOSIS — T753XXA Motion sickness, initial encounter: Secondary | ICD-10-CM | POA: Diagnosis not present

## 2019-11-15 DIAGNOSIS — J019 Acute sinusitis, unspecified: Secondary | ICD-10-CM | POA: Diagnosis not present

## 2019-11-20 ENCOUNTER — Other Ambulatory Visit: Payer: Self-pay

## 2019-11-20 ENCOUNTER — Ambulatory Visit
Admission: RE | Admit: 2019-11-20 | Discharge: 2019-11-20 | Disposition: A | Discharge status: Home / Self Care | Payer: BC Managed Care – PPO | Source: Ambulatory Visit | Attending: Family Medicine | Admitting: Family Medicine

## 2019-11-20 DIAGNOSIS — Z1231 Encounter for screening mammogram for malignant neoplasm of breast: Secondary | ICD-10-CM

## 2019-12-23 ENCOUNTER — Encounter: Payer: Self-pay | Admitting: Gastroenterology

## 2019-12-23 ENCOUNTER — Other Ambulatory Visit: Payer: Self-pay

## 2019-12-23 ENCOUNTER — Ambulatory Visit: Payer: BC Managed Care – PPO | Admitting: Gastroenterology

## 2019-12-23 VITALS — BP 155/89 | HR 90 | Ht 64.0 in | Wt 223.0 lb

## 2019-12-23 DIAGNOSIS — K589 Irritable bowel syndrome without diarrhea: Secondary | ICD-10-CM | POA: Diagnosis not present

## 2019-12-23 MED ORDER — DICYCLOMINE HCL 20 MG PO TABS: ORAL_TABLET | ORAL | 6 refills | Status: DC

## 2019-12-23 NOTE — Progress Notes (Signed)
Primary Care Physician: Serita Butcher, FNP  Primary Gastroenterologist:  Dr. Lucilla Lame  Chief Complaint  Patient presents with  . Medication Refill    HPI: Natalie Gordon is a 49 y.o. female here for follow-up of her irritable bowel syndrome.  The patient states she is doing much better with the dicyclomine 20 mg 3 times a day but still has breakthrough abdominal pain.  She states that if she does not take it her symptoms are much worse.  There is no report of any unexplained weight loss fevers chills nausea vomiting black stools or bloody stools and in fact she states she has felt like she gained weight although the scales do not show that she has gained any weight.  Past Medical History:  Diagnosis Date  . Anxiety   . GERD (gastroesophageal reflux disease)   . History of abnormal Pap smear    At age 49  . Hypertension   . Irregular menstrual cycle   . Morbid obesity ([place])   . PONV (postoperative nausea and vomiting)   . Wears contact lenses     Current Outpatient Medications  Medication Sig Dispense Refill  . ANUCORT-HC 25 MG suppository Place 25 mg rectally 2 (two) times daily.    . Cholecalciferol (VITAMIN D-1000 MAX ST) 1000 units tablet Take by mouth.    . dicyclomine (BENTYL) 20 MG tablet TAKE 1 TABLET BY MOUTH THREE TIMES DAILY BEFORE MEALS 90 tablet 6  . fluticasone (FLONASE) 50 MCG/ACT nasal spray Place into both nostrils daily.    . hydrochlorothiazide (HYDRODIURIL) 25 MG tablet Take 1 tablet (25 mg total) by mouth daily. 30 tablet 6  . Multiple Vitamin (MULTIVITAMIN WITH MINERALS) TABS Take 1 tablet by mouth daily.    Marland Kitchen omeprazole (PRILOSEC) 40 MG capsule Take 40 mg by mouth daily.    . phentermine 37.5 MG capsule Take 1 capsule (37.5 mg total) by mouth daily. 30 capsule 2  . vitamin E 180 MG (400 UNITS) capsule Take by mouth.    Marland Kitchen VITAMIN E PO Take by mouth.     No current facility-administered medications for this visit.    Allergies as of  12/23/2019  . (No Known Allergies)    ROS:  General: Negative for anorexia, weight loss, fever, chills, fatigue, weakness. ENT: Negative for hoarseness, difficulty swallowing , nasal congestion. CV: Negative for chest pain, angina, palpitations, dyspnea on exertion, peripheral edema.  Respiratory: Negative for dyspnea at rest, dyspnea on exertion, cough, sputum, wheezing.  GI: See history of present illness. GU:  Negative for dysuria, hematuria, urinary incontinence, urinary frequency, nocturnal urination.  Endo: Negative for unusual weight change.    Physical Examination:   BP (!) 155/89   Pulse 90   Ht 5\' 4"  (1.626 m)   Wt 223 lb (101.2 kg)   BMI 38.28 kg/m   General: Well-nourished, well-developed in no acute distress.  Eyes: No icterus. Conjunctivae pink. Extremities: No lower extremity edema. No clubbing or deformities. Neuro: Alert and oriented x 3.  Grossly intact. Psych: Alert and cooperative, normal mood and affect.  Labs:    Imaging Studies: No results found.  Assessment and Plan:   Natalie Gordon is a 49 y.o. y/o female who comes in today for a medication refill of her dicyclomine.  The patient continues to have abdominal cramps and has been given samples of IBD guard(peppermint oil) to see if this helps her symptoms.  The patient has been explained the plan and agrees  with it.  She will follow-up as needed.     Lucilla Lame, MD. Marval Regal    Note: This dictation was prepared with Dragon dictation along with smaller phrase technology. Any transcriptional errors that result from this process are unintentional.

## 2020-01-03 DIAGNOSIS — J019 Acute sinusitis, unspecified: Secondary | ICD-10-CM | POA: Diagnosis not present

## 2020-03-17 DIAGNOSIS — J069 Acute upper respiratory infection, unspecified: Secondary | ICD-10-CM | POA: Diagnosis not present

## 2020-05-26 ENCOUNTER — Encounter: Payer: Self-pay | Admitting: Radiology

## 2020-06-04 DIAGNOSIS — Z0189 Encounter for other specified special examinations: Secondary | ICD-10-CM | POA: Diagnosis not present

## 2020-06-08 DIAGNOSIS — Z713 Dietary counseling and surveillance: Secondary | ICD-10-CM | POA: Diagnosis not present

## 2020-06-08 DIAGNOSIS — Z043 Encounter for examination and observation following other accident: Secondary | ICD-10-CM | POA: Diagnosis not present

## 2020-06-08 DIAGNOSIS — E785 Hyperlipidemia, unspecified: Secondary | ICD-10-CM | POA: Diagnosis not present

## 2020-07-21 ENCOUNTER — Encounter: Payer: Self-pay | Admitting: Family Medicine

## 2020-07-21 ENCOUNTER — Other Ambulatory Visit: Payer: Self-pay

## 2020-07-21 ENCOUNTER — Ambulatory Visit (INDEPENDENT_AMBULATORY_CARE_PROVIDER_SITE_OTHER): Payer: BC Managed Care – PPO | Admitting: Family Medicine

## 2020-07-21 VITALS — BP 142/87 | HR 74 | Wt 227.4 lb

## 2020-07-21 DIAGNOSIS — Z124 Encounter for screening for malignant neoplasm of cervix: Secondary | ICD-10-CM | POA: Diagnosis not present

## 2020-07-21 DIAGNOSIS — I10 Essential (primary) hypertension: Secondary | ICD-10-CM | POA: Diagnosis not present

## 2020-07-21 DIAGNOSIS — E6609 Other obesity due to excess calories: Secondary | ICD-10-CM | POA: Diagnosis not present

## 2020-07-21 DIAGNOSIS — Z01419 Encounter for gynecological examination (general) (routine) without abnormal findings: Secondary | ICD-10-CM | POA: Diagnosis not present

## 2020-07-21 DIAGNOSIS — Z6834 Body mass index (BMI) 34.0-34.9, adult: Secondary | ICD-10-CM

## 2020-07-21 MED ORDER — PHENTERMINE HCL 37.5 MG PO CAPS: 37.5000 mg | ORAL_CAPSULE | Freq: Every day | ORAL | 2 refills | Status: DC

## 2020-07-21 NOTE — Progress Notes (Signed)
°  Subjective:     Natalie Gordon is a 50 y.o. female and is here for a comprehensive physical exam. The patient reports problems - desires to lose weight. Has labs through work.     The following portions of the patient's history were reviewed and updated as appropriate: allergies, current medications, past family history, past medical history, past social history, past surgical history and problem list.  Review of Systems Pertinent items noted in HPI and remainder of comprehensive ROS otherwise negative.   Objective:    BP (!) 142/87    Pulse 74    Wt 227 lb 6.4 oz (103.1 kg)    LMP 06/16/2020    BMI 39.03 kg/m  General appearance: alert, cooperative and appears stated age Head: Normocephalic, without obvious abnormality, atraumatic Neck: no adenopathy, supple, symmetrical, trachea midline and thyroid not enlarged, symmetric, no tenderness/mass/nodules Lungs: clear to auscultation bilaterally Breasts: normal appearance, no masses or tenderness Heart: regular rate and rhythm, S1, S2 normal, no murmur, click, rub or gallop Abdomen: soft, non-tender; bowel sounds normal; no masses,  no organomegaly Pelvic: cervix normal in appearance, external genitalia normal, no adnexal masses or tenderness, no cervical motion tenderness, uterus normal size, shape, and consistency and vagina normal without discharge Extremities: extremities normal, atraumatic, no cyanosis or edema Pulses: 2+ and symmetric Skin: Skin color, texture, turgor normal. No rashes or lesions Lymph nodes: Cervical, supraclavicular, and axillary nodes normal. Neurologic: Grossly normal    Assessment:    Healthy female exam.      Plan:   Problem List Items Addressed This Visit      Unprioritized   Obesity    Few months of Phentermine, discussed Wegovy, etc.      Relevant Medications   phentermine 37.5 MG capsule   Essential hypertension - Primary    Monitor BP, DASH diet       Other Visit Diagnoses     Screening for malignant neoplasm of cervix       Relevant Orders   IGP, Aptima HPV, rfx 16/18,45   Encounter for gynecological examination without abnormal finding       Relevant Orders   MM DIGITAL SCREENING BILATERAL     Return in 1 year (on 07/21/2021).    See After Visit Summary for Counseling Recommendations

## 2020-07-22 ENCOUNTER — Encounter: Payer: Self-pay | Admitting: *Deleted

## 2020-07-22 NOTE — Assessment & Plan Note (Signed)
Monitor BP, DASH diet

## 2020-07-22 NOTE — Assessment & Plan Note (Signed)
Few months of Phentermine, discussed Wegovy, etc.

## 2020-07-24 LAB — IGP, APTIMA HPV, RFX 16/18,45
HPV Aptima: NEGATIVE
PAP Smear Comment: 0

## 2020-10-06 ENCOUNTER — Other Ambulatory Visit: Payer: Self-pay | Admitting: Family Medicine

## 2020-10-06 DIAGNOSIS — Z1231 Encounter for screening mammogram for malignant neoplasm of breast: Secondary | ICD-10-CM

## 2020-11-16 DIAGNOSIS — J302 Other seasonal allergic rhinitis: Secondary | ICD-10-CM | POA: Diagnosis not present

## 2020-11-23 ENCOUNTER — Ambulatory Visit
Admission: RE | Admit: 2020-11-23 | Discharge: 2020-11-23 | Disposition: A | Discharge status: Home / Self Care | Payer: BC Managed Care – PPO | Source: Ambulatory Visit | Attending: Family Medicine | Admitting: Family Medicine

## 2020-11-23 ENCOUNTER — Other Ambulatory Visit: Payer: Self-pay

## 2020-11-23 DIAGNOSIS — Z1231 Encounter for screening mammogram for malignant neoplasm of breast: Secondary | ICD-10-CM

## 2021-03-30 DIAGNOSIS — J069 Acute upper respiratory infection, unspecified: Secondary | ICD-10-CM | POA: Diagnosis not present

## 2021-05-12 DIAGNOSIS — U071 COVID-19: Secondary | ICD-10-CM | POA: Diagnosis not present

## 2021-05-17 DIAGNOSIS — R059 Cough, unspecified: Secondary | ICD-10-CM | POA: Diagnosis not present

## 2021-05-17 DIAGNOSIS — U071 COVID-19: Secondary | ICD-10-CM | POA: Diagnosis not present

## 2021-06-11 DIAGNOSIS — Z713 Dietary counseling and surveillance: Secondary | ICD-10-CM | POA: Diagnosis not present

## 2021-06-11 DIAGNOSIS — I10 Essential (primary) hypertension: Secondary | ICD-10-CM | POA: Diagnosis not present

## 2021-06-11 DIAGNOSIS — Z043 Encounter for examination and observation following other accident: Secondary | ICD-10-CM | POA: Diagnosis not present

## 2021-06-12 ENCOUNTER — Encounter: Payer: Self-pay | Admitting: Radiology

## 2021-07-08 ENCOUNTER — Telehealth: Payer: Self-pay

## 2021-07-08 ENCOUNTER — Telehealth: Payer: Self-pay | Admitting: Gastroenterology

## 2021-07-08 ENCOUNTER — Other Ambulatory Visit: Payer: Self-pay | Admitting: Family Medicine

## 2021-07-08 DIAGNOSIS — Z1231 Encounter for screening mammogram for malignant neoplasm of breast: Secondary | ICD-10-CM

## 2021-07-08 NOTE — Telephone Encounter (Signed)
Patient called with question she is not due till 2029 for colonoscopy per dr Allen Norris letter after last colonoscopy ?

## 2021-07-08 NOTE — Telephone Encounter (Signed)
Pt is requesting an appointment for an colonoscopy  in June on a Friday if possible ?

## 2021-08-04 ENCOUNTER — Ambulatory Visit (INDEPENDENT_AMBULATORY_CARE_PROVIDER_SITE_OTHER): Payer: BC Managed Care – PPO | Admitting: Family Medicine

## 2021-08-04 ENCOUNTER — Encounter: Payer: Self-pay | Admitting: Family Medicine

## 2021-08-04 VITALS — BP 170/100 | HR 81 | Ht 64.0 in | Wt 223.6 lb

## 2021-08-04 DIAGNOSIS — I10 Essential (primary) hypertension: Secondary | ICD-10-CM | POA: Diagnosis not present

## 2021-08-04 DIAGNOSIS — Z01419 Encounter for gynecological examination (general) (routine) without abnormal findings: Secondary | ICD-10-CM

## 2021-08-04 NOTE — Progress Notes (Signed)
Subjective:  ?  ? Natalie Gordon is a 51 y.o. female and is here for a comprehensive physical exam. The patient reports no problems. ? ? ? ?The following portions of the patient's history were reviewed and updated as appropriate: allergies, current medications, past family history, past medical history, past social history, past surgical history, and problem list. ? ?Review of Systems ?Pertinent items noted in HPI and remainder of comprehensive ROS otherwise negative.  ? ?Objective:  ? ? BP (!) 170/100   Pulse 81   Ht '5\' 4"'$  (1.626 m)   Wt 223 lb 9.6 oz (101.4 kg)   BMI 38.38 kg/m?  ?General appearance: alert, cooperative, and appears stated age ?Head: Normocephalic, without obvious abnormality, atraumatic ?Neck: no adenopathy, supple, symmetrical, trachea midline, and thyroid not enlarged, symmetric, no tenderness/mass/nodules ?Lungs: clear to auscultation bilaterally ?Breasts: normal appearance, no masses or tenderness ?Heart: regular rate and rhythm, S1, S2 normal, no murmur, click, rub or gallop ?Abdomen: soft, non-tender; bowel sounds normal; no masses,  no organomegaly ?Extremities: extremities normal, atraumatic, no cyanosis or edema ?Pulses: 2+ and symmetric ?Skin: Skin color, texture, turgor normal. No rashes or lesions ?Lymph nodes: Cervical, supraclavicular, and axillary nodes normal. ?Neurologic: Grossly normal  ?  ?Assessment:  ? ? Healthy female exam.    ?  ?Plan:  ? ?Problem List Items Addressed This Visit   ? ?  ? Unprioritized  ? Essential hypertension  ?  To f/u with PCP ?  ?  ? ?Other Visit Diagnoses   ? ? Encounter for gynecological examination without abnormal finding    -  Primary  ? Pap done 07/21/2020  ? ?  ? ?Return in 1 year (on 08/05/2022). ? ?  ?See After Visit Summary for Counseling Recommendations  ? ?

## 2021-08-04 NOTE — Assessment & Plan Note (Signed)
To f/u with PCP ?

## 2021-08-31 NOTE — Telephone Encounter (Signed)
error 

## 2021-11-25 ENCOUNTER — Ambulatory Visit
Admission: RE | Admit: 2021-11-25 | Discharge: 2021-11-25 | Disposition: A | Discharge status: Home / Self Care | Payer: BC Managed Care – PPO | Source: Ambulatory Visit | Attending: Family Medicine | Admitting: Family Medicine

## 2021-11-25 DIAGNOSIS — Z1231 Encounter for screening mammogram for malignant neoplasm of breast: Secondary | ICD-10-CM | POA: Diagnosis not present

## 2021-12-15 ENCOUNTER — Ambulatory Visit: Payer: BC Managed Care – PPO | Admitting: Family Medicine

## 2022-01-23 IMAGING — MG MM DIGITAL SCREENING BILAT W/ TOMO AND CAD
6 of 12 series · 6 of 36 positions shown · non-contrast
Comparison: Previous exam(s).

CLINICAL DATA: Screening.

EXAM:
DIGITAL SCREENING BILATERAL MAMMOGRAM WITH TOMOSYNTHESIS AND CAD
TECHNIQUE: Bilateral screening digital craniocaudal and mediolateral oblique
mammograms were obtained. Bilateral screening digital breast
tomosynthesis was performed. The images were evaluated with
computer-aided detection.

[R MLO synth-2D]
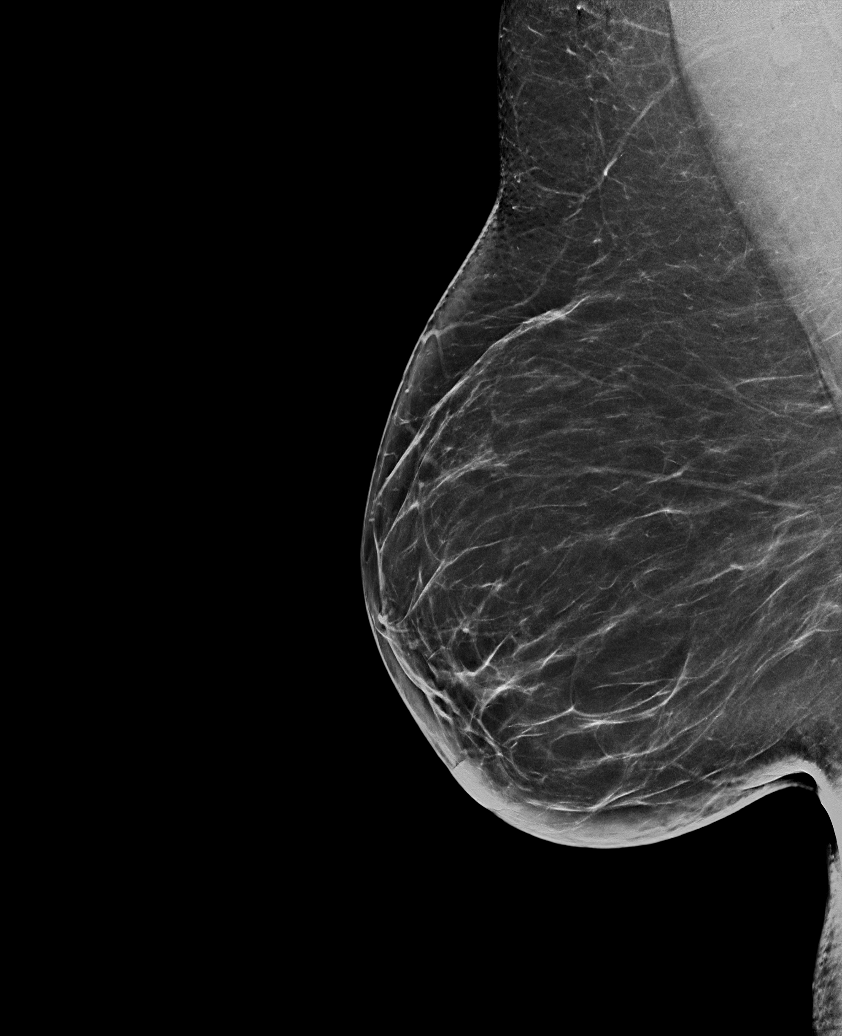

[L MLO synth-2D]
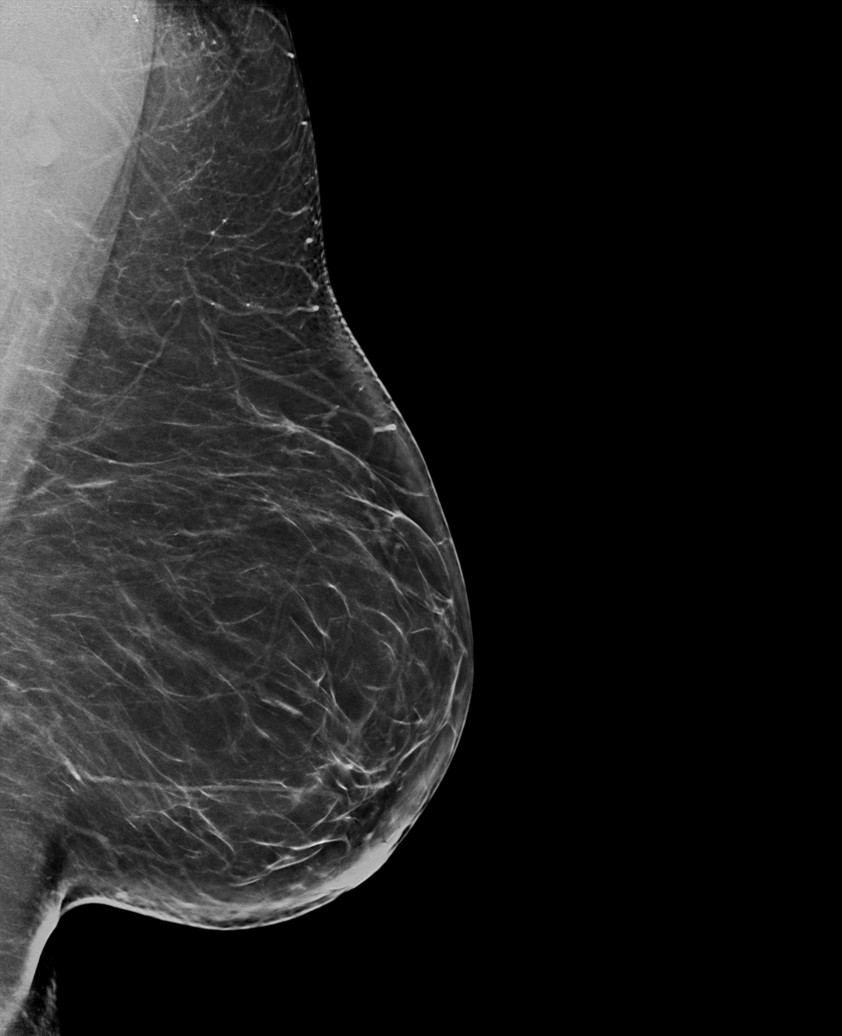

[L CC synth-2D (1 of 2)]
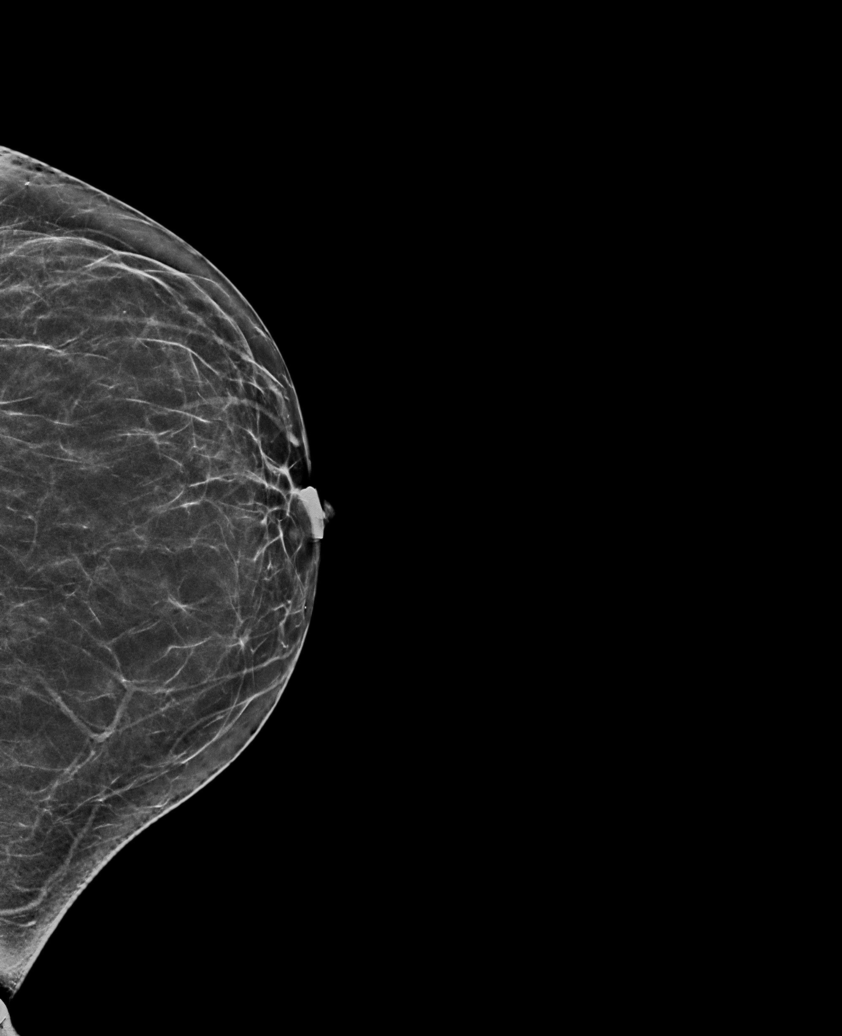

[L CC synth-2D (2 of 2)]
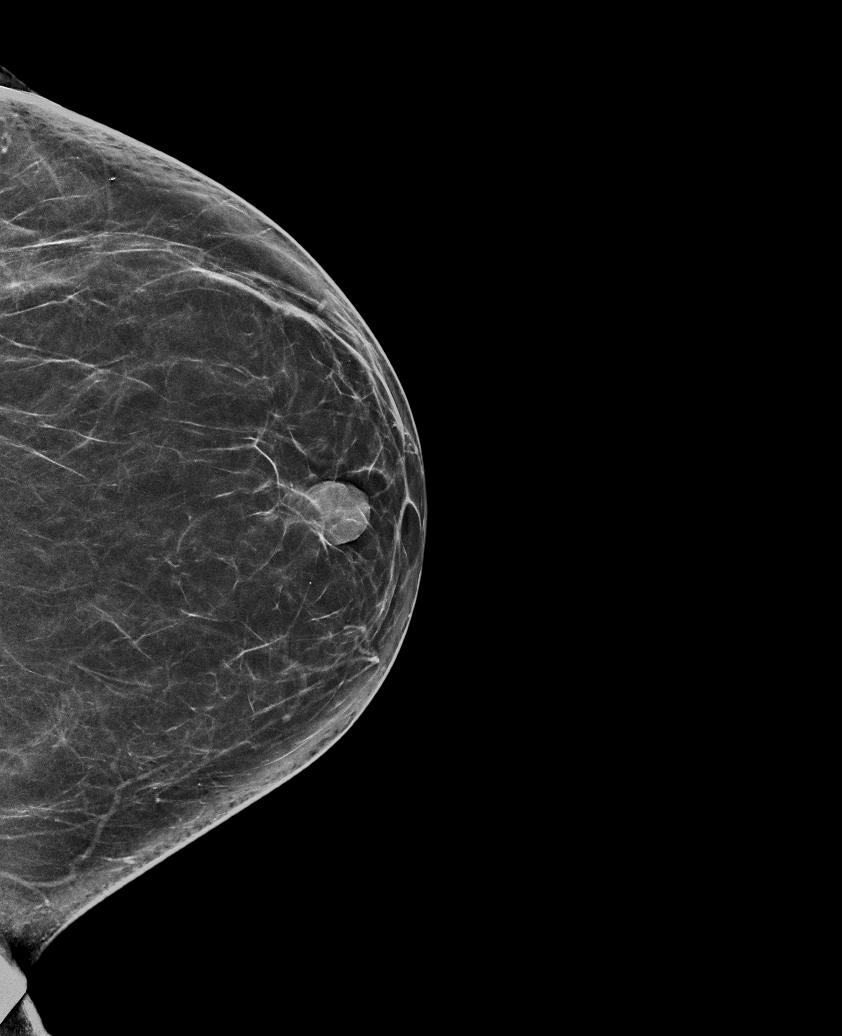

[R CC synth-2D (1 of 2)]
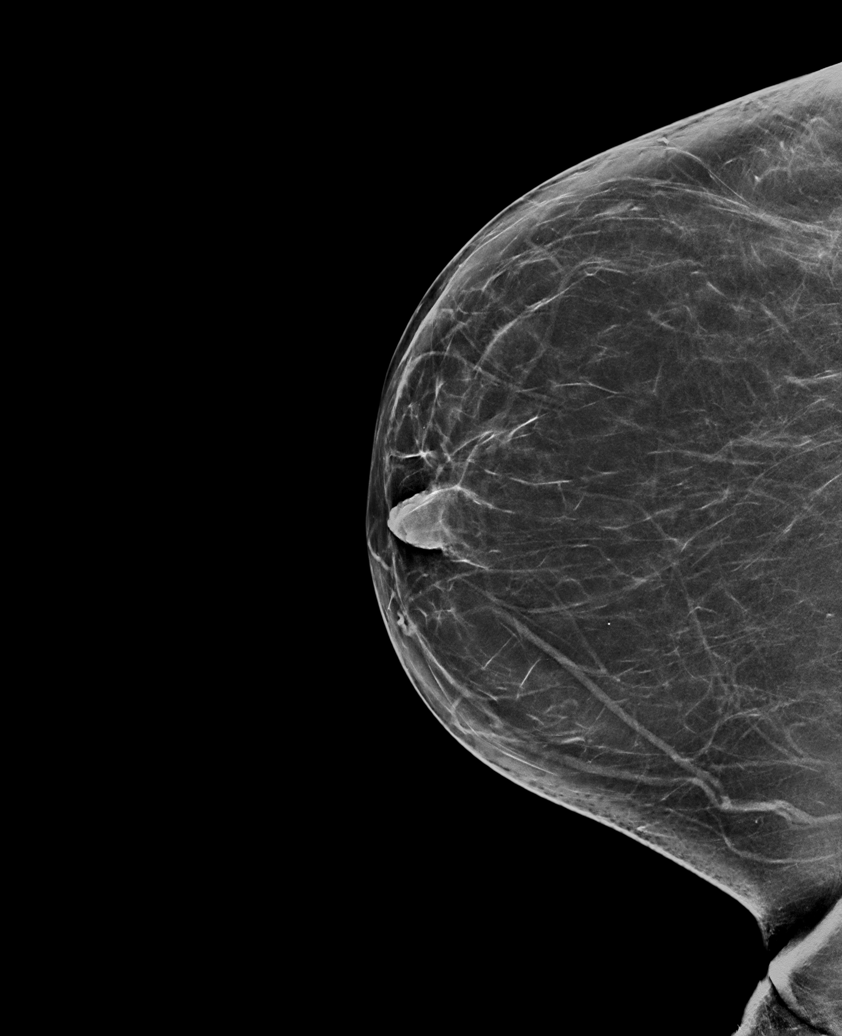

[R CC synth-2D (2 of 2)]
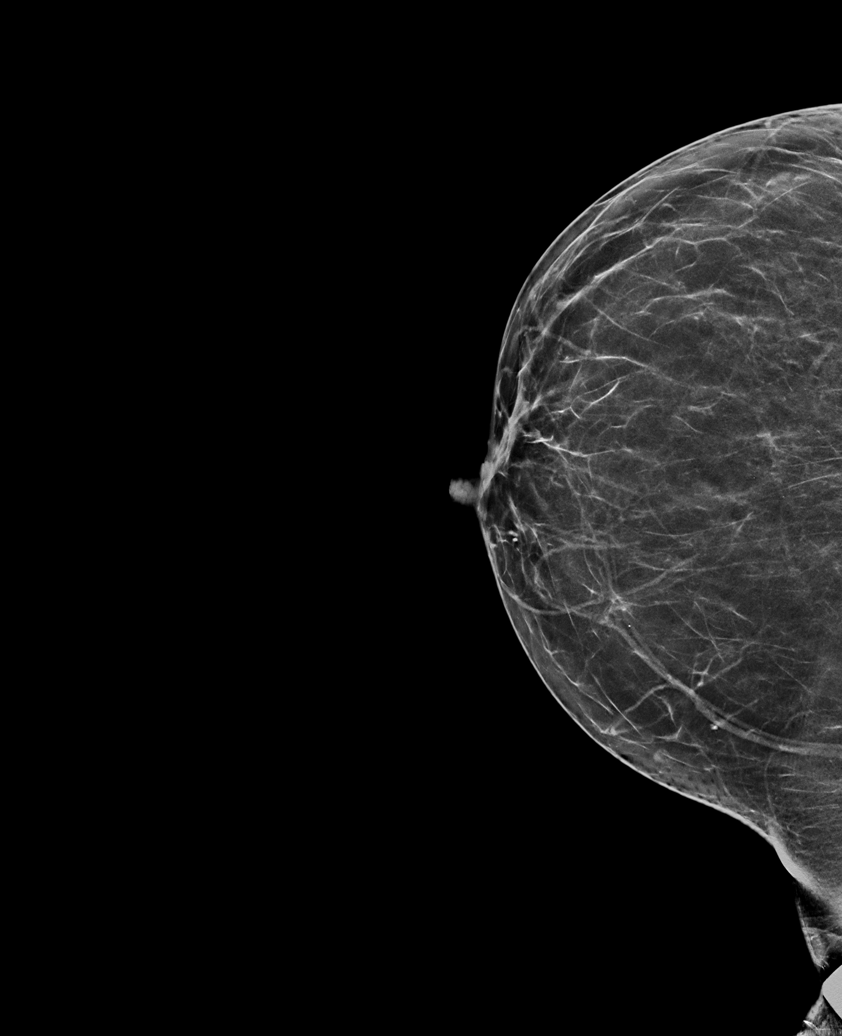

[6 of 36 positions shown; findings below may reference images not displayed]

ACR Breast Density Category b: There are scattered areas of
fibroglandular density.
FINDINGS: There are no findings suspicious for malignancy.
IMPRESSION: No mammographic evidence of malignancy. A result letter of this
screening mammogram will be mailed directly to the patient.

RECOMMENDATION:
Screening mammogram in one year. (Code:51-O-LD2)

BI-RADS CATEGORY  1: Negative.

## 2022-05-27 DIAGNOSIS — A499 Bacterial infection, unspecified: Secondary | ICD-10-CM | POA: Diagnosis not present

## 2022-06-12 DIAGNOSIS — B3731 Acute candidiasis of vulva and vagina: Secondary | ICD-10-CM | POA: Diagnosis not present

## 2022-06-14 DIAGNOSIS — R7303 Prediabetes: Secondary | ICD-10-CM | POA: Insufficient documentation

## 2022-06-14 DIAGNOSIS — E6609 Other obesity due to excess calories: Secondary | ICD-10-CM | POA: Diagnosis not present

## 2022-06-14 DIAGNOSIS — R030 Elevated blood-pressure reading, without diagnosis of hypertension: Secondary | ICD-10-CM | POA: Diagnosis not present

## 2022-06-14 DIAGNOSIS — E790 Hyperuricemia without signs of inflammatory arthritis and tophaceous disease: Secondary | ICD-10-CM | POA: Insufficient documentation

## 2022-06-14 DIAGNOSIS — E119 Type 2 diabetes mellitus without complications: Secondary | ICD-10-CM | POA: Insufficient documentation

## 2022-06-14 DIAGNOSIS — D124 Benign neoplasm of descending colon: Secondary | ICD-10-CM | POA: Diagnosis not present

## 2022-06-14 DIAGNOSIS — I10 Essential (primary) hypertension: Secondary | ICD-10-CM | POA: Diagnosis not present

## 2022-06-14 DIAGNOSIS — E559 Vitamin D deficiency, unspecified: Secondary | ICD-10-CM | POA: Insufficient documentation

## 2022-06-30 ENCOUNTER — Other Ambulatory Visit: Payer: Self-pay | Admitting: Family Medicine

## 2022-06-30 DIAGNOSIS — Z1231 Encounter for screening mammogram for malignant neoplasm of breast: Secondary | ICD-10-CM

## 2022-08-10 ENCOUNTER — Ambulatory Visit (INDEPENDENT_AMBULATORY_CARE_PROVIDER_SITE_OTHER): Payer: BC Managed Care – PPO | Admitting: Family Medicine

## 2022-08-10 ENCOUNTER — Encounter: Payer: Self-pay | Admitting: Family Medicine

## 2022-08-10 VITALS — BP 155/92 | HR 84 | Wt 234.0 lb

## 2022-08-10 DIAGNOSIS — Z01419 Encounter for gynecological examination (general) (routine) without abnormal findings: Secondary | ICD-10-CM

## 2022-08-10 DIAGNOSIS — Z6834 Body mass index (BMI) 34.0-34.9, adult: Secondary | ICD-10-CM

## 2022-08-10 DIAGNOSIS — R7303 Prediabetes: Secondary | ICD-10-CM | POA: Diagnosis not present

## 2022-08-10 DIAGNOSIS — E6609 Other obesity due to excess calories: Secondary | ICD-10-CM | POA: Diagnosis not present

## 2022-08-10 MED ORDER — METFORMIN HCL ER (OSM) 500 MG PO TB24
500.0000 mg | ORAL_TABLET | Freq: Every day | ORAL | 1 refills | Status: DC
Start: 2022-08-10 — End: 2023-09-06

## 2022-08-10 NOTE — Assessment & Plan Note (Signed)
Not a good candidate for Phentermine given her BP

## 2022-08-10 NOTE — Progress Notes (Signed)
Subjective:     Natalie Gordon is a 52 y.o. female and is here for a comprehensive physical exam. The patient reports no problems. Cannot lose weight.   The following portions of the patient's history were reviewed and updated as appropriate: allergies, current medications, past family history, past medical history, past social history, past surgical history, and problem list.  Review of Systems Pertinent items noted in HPI and remainder of comprehensive ROS otherwise negative.   Objective:    BP (!) 155/92   Pulse 84   Wt 234 lb (106.1 kg)   BMI 40.17 kg/m  General appearance: alert, cooperative, and appears stated age Head: Normocephalic, without obvious abnormality, atraumatic Neck: no adenopathy, supple, symmetrical, trachea midline, and thyroid not enlarged, symmetric, no tenderness/mass/nodules Lungs: clear to auscultation bilaterally Heart: regular rate and rhythm, S1, S2 normal, no murmur, click, rub or gallop Abdomen: soft, non-tender; bowel sounds normal; no masses,  no organomegaly Extremities: extremities normal, atraumatic, no cyanosis or edema Skin: Skin color, texture, turgor normal. No rashes or lesions Neurologic: Grossly normal    Assessment:    GYN female exam.      Plan:   Problem List Items Addressed This Visit       Unprioritized   Obesity    Not a good candidate for Phentermine given her BP      Relevant Medications   metformin (FORTAMET) 500 MG (OSM) 24 hr tablet   Prediabetes    Has been unable to get GLP-1      Relevant Medications   metformin (FORTAMET) 500 MG (OSM) 24 hr tablet   Other Visit Diagnoses     Encounter for gynecological examination without abnormal finding    -  Primary   Mammogram is scheduled for 11/2022. Pap is up to date.      Return in 1 year (on 08/10/2023).     See After Visit Summary for Counseling Recommendations

## 2022-08-10 NOTE — Progress Notes (Signed)
Patient presents for Annual. Does have night sweats   LMP: Slight periods, week of 07/22/22- x 3-4 days  Last pap: Date: 07/21/20 Contraception: None Mammogram:  scheduled for 11/28/22 Colonoscopy: 2019  STD Screening: not indicated Flu Vaccine : N/A  CC:  Annual, denies any concerns  discuss phentermine   Fun Fact: Pt likes Cold beer

## 2022-08-10 NOTE — Assessment & Plan Note (Signed)
Has been unable to get GLP-1

## 2022-08-24 ENCOUNTER — Encounter: Payer: Self-pay | Admitting: Family Medicine

## 2022-11-28 ENCOUNTER — Ambulatory Visit
Admission: RE | Admit: 2022-11-28 | Discharge: 2022-11-28 | Disposition: A | Discharge status: Home / Self Care | Payer: BC Managed Care – PPO | Source: Ambulatory Visit | Attending: Family Medicine | Admitting: Family Medicine

## 2022-11-28 DIAGNOSIS — Z1231 Encounter for screening mammogram for malignant neoplasm of breast: Secondary | ICD-10-CM | POA: Insufficient documentation

## 2022-12-06 DIAGNOSIS — I10 Essential (primary) hypertension: Secondary | ICD-10-CM | POA: Diagnosis not present

## 2022-12-06 DIAGNOSIS — R7303 Prediabetes: Secondary | ICD-10-CM | POA: Diagnosis not present

## 2022-12-06 DIAGNOSIS — E6609 Other obesity due to excess calories: Secondary | ICD-10-CM | POA: Diagnosis not present

## 2022-12-06 DIAGNOSIS — Z09 Encounter for follow-up examination after completed treatment for conditions other than malignant neoplasm: Secondary | ICD-10-CM | POA: Diagnosis not present

## 2022-12-27 DIAGNOSIS — R7303 Prediabetes: Secondary | ICD-10-CM | POA: Diagnosis not present

## 2022-12-27 DIAGNOSIS — I10 Essential (primary) hypertension: Secondary | ICD-10-CM | POA: Diagnosis not present

## 2023-07-28 ENCOUNTER — Other Ambulatory Visit: Payer: Self-pay | Admitting: Family Medicine

## 2023-07-28 DIAGNOSIS — Z1231 Encounter for screening mammogram for malignant neoplasm of breast: Secondary | ICD-10-CM

## 2023-08-06 DIAGNOSIS — K219 Gastro-esophageal reflux disease without esophagitis: Secondary | ICD-10-CM | POA: Insufficient documentation

## 2023-09-06 ENCOUNTER — Encounter: Payer: Self-pay | Admitting: Family Medicine

## 2023-09-06 ENCOUNTER — Ambulatory Visit: Admitting: Family Medicine

## 2023-09-06 ENCOUNTER — Other Ambulatory Visit (HOSPITAL_COMMUNITY)
Admission: RE | Admit: 2023-09-06 | Discharge: 2023-09-06 | Disposition: A | Discharge status: Home / Self Care | Source: Ambulatory Visit | Attending: Family Medicine | Admitting: Family Medicine

## 2023-09-06 VITALS — BP 135/91 | HR 82 | Wt 210.6 lb

## 2023-09-06 DIAGNOSIS — Z124 Encounter for screening for malignant neoplasm of cervix: Secondary | ICD-10-CM | POA: Insufficient documentation

## 2023-09-06 DIAGNOSIS — Z01419 Encounter for gynecological examination (general) (routine) without abnormal findings: Secondary | ICD-10-CM

## 2023-09-06 NOTE — Progress Notes (Signed)
 Subjective:     Natalie Gordon is a 53 y.o. female and is here for a comprehensive physical exam. The patient reports problems - occasional hot flash. Still having light and irregular cycles.   The following portions of the patient's history were reviewed and updated as appropriate: allergies, current medications, past family history, past medical history, past social history, past surgical history, and problem list.  Review of Systems Pertinent items are noted in HPI.   Objective:  Chaperone present for exam   BP (!) 135/91   Pulse 82   Wt 210 lb 9.6 oz (95.5 kg)   BMI 36.15 kg/m  General appearance: alert, cooperative, and appears stated age Head: Normocephalic, without obvious abnormality, atraumatic Neck: no adenopathy, supple, symmetrical, trachea midline, and thyroid not enlarged, symmetric, no tenderness/mass/nodules Lungs: clear to auscultation bilaterally Breasts: normal appearance, no masses or tenderness Heart: regular rate and rhythm, S1, S2 normal, no murmur, click, rub or gallop Abdomen: soft, non-tender; bowel sounds normal; no masses,  no organomegaly Pelvic: cervix normal in appearance, external genitalia normal, no adnexal masses or tenderness, no cervical motion tenderness, uterus normal size, shape, and consistency, and vagina normal without discharge Extremities: extremities normal, atraumatic, no cyanosis or edema Pulses: 2+ and symmetric Skin: Skin color, texture, turgor normal. No rashes or lesions Lymph nodes: Cervical, supraclavicular, and axillary nodes normal. Neurologic: Grossly normal    Assessment:    GYN female exam.      Plan:  Screening for malignant neoplasm of cervix - Plan: Cytology - PAP  Encounter for gynecological examination without abnormal finding Mammogram is scheduled for July   See After Visit Summary for Counseling Recommendations

## 2023-09-06 NOTE — Progress Notes (Signed)
 Patient presents for Annual.  LMP: No LMP recorded. (Menstrual status: Perimenopausal).  Last pap: Date: 2022-WNL  Contraception:  tubal ligation  Mammogram:  Scheduled 11/2023 STD Screening: Declines Flu Vaccine : N/A  CC:  Annual

## 2023-09-11 ENCOUNTER — Encounter: Payer: Self-pay | Admitting: Family Medicine

## 2023-09-11 LAB — CYTOLOGY - PAP
Comment: NEGATIVE
Diagnosis: NEGATIVE
High risk HPV: NEGATIVE

## 2023-12-05 ENCOUNTER — Ambulatory Visit
Admission: RE | Admit: 2023-12-05 | Discharge: 2023-12-05 | Disposition: A | Discharge status: Home / Self Care | Source: Ambulatory Visit | Attending: Family Medicine | Admitting: Family Medicine

## 2023-12-05 DIAGNOSIS — Z1231 Encounter for screening mammogram for malignant neoplasm of breast: Secondary | ICD-10-CM | POA: Diagnosis present

## 2023-12-07 ENCOUNTER — Other Ambulatory Visit: Payer: Self-pay | Admitting: Family Medicine

## 2023-12-07 DIAGNOSIS — R928 Other abnormal and inconclusive findings on diagnostic imaging of breast: Secondary | ICD-10-CM

## 2023-12-18 ENCOUNTER — Ambulatory Visit
Admission: RE | Admit: 2023-12-18 | Discharge: 2023-12-18 | Disposition: A | Discharge status: Home / Self Care | Source: Ambulatory Visit | Attending: Family Medicine | Admitting: Family Medicine

## 2023-12-18 DIAGNOSIS — R928 Other abnormal and inconclusive findings on diagnostic imaging of breast: Secondary | ICD-10-CM | POA: Insufficient documentation
